# Patient Record
Sex: Male | Born: 1960 | Race: White | Hispanic: No | Marital: Married | Smoking: Former smoker
Health system: Southern US, Community
[De-identification: ages and names within clinical notes are randomized; demographics above are authoritative.]

## PROBLEM LIST (undated history)

## (undated) DIAGNOSIS — R0683 Snoring: Secondary | ICD-10-CM

## (undated) DIAGNOSIS — E669 Obesity, unspecified: Secondary | ICD-10-CM

## (undated) DIAGNOSIS — M109 Gout, unspecified: Secondary | ICD-10-CM

## (undated) DIAGNOSIS — N183 Chronic kidney disease, stage 3 unspecified: Secondary | ICD-10-CM

## (undated) DIAGNOSIS — E781 Pure hyperglyceridemia: Secondary | ICD-10-CM

## (undated) DIAGNOSIS — E8881 Metabolic syndrome: Secondary | ICD-10-CM

## (undated) DIAGNOSIS — C20 Malignant neoplasm of rectum: Secondary | ICD-10-CM

## (undated) DIAGNOSIS — I10 Essential (primary) hypertension: Secondary | ICD-10-CM

## (undated) DIAGNOSIS — Z87442 Personal history of urinary calculi: Secondary | ICD-10-CM

## (undated) DIAGNOSIS — K4090 Unilateral inguinal hernia, without obstruction or gangrene, not specified as recurrent: Secondary | ICD-10-CM

## (undated) DIAGNOSIS — H9319 Tinnitus, unspecified ear: Secondary | ICD-10-CM

## (undated) DIAGNOSIS — R7301 Impaired fasting glucose: Secondary | ICD-10-CM

## (undated) DIAGNOSIS — N200 Calculus of kidney: Secondary | ICD-10-CM

## (undated) HISTORY — DX: Metabolic syndrome: E88.810

## (undated) HISTORY — DX: Impaired fasting glucose: R73.01

## (undated) HISTORY — DX: Snoring: R06.83

## (undated) HISTORY — DX: Gout, unspecified: M10.9

## (undated) HISTORY — DX: Calculus of kidney: N20.0

## (undated) HISTORY — DX: Chronic kidney disease, stage 3 (moderate): N18.3

## (undated) HISTORY — DX: Pure hyperglyceridemia: E78.1

## (undated) HISTORY — DX: Chronic kidney disease, stage 3 unspecified: N18.30

## (undated) HISTORY — DX: Metabolic syndrome: E88.81

---

## 2011-05-11 DIAGNOSIS — Z87442 Personal history of urinary calculi: Secondary | ICD-10-CM

## 2011-05-11 HISTORY — DX: Personal history of urinary calculi: Z87.442

## 2015-05-11 HISTORY — PX: XI ROBOTIC ASSISTED LOWER ANTERIOR RESECTION: SHX6558

## 2015-10-08 ENCOUNTER — Other Ambulatory Visit: Payer: Self-pay | Admitting: Gastroenterology

## 2015-10-08 DIAGNOSIS — K629 Disease of anus and rectum, unspecified: Secondary | ICD-10-CM

## 2015-10-09 ENCOUNTER — Ambulatory Visit
Admission: RE | Admit: 2015-10-09 | Discharge: 2015-10-09 | Disposition: A | Discharge status: Home / Self Care | Payer: BLUE CROSS/BLUE SHIELD | Source: Ambulatory Visit | Attending: Gastroenterology | Admitting: Gastroenterology

## 2015-10-09 DIAGNOSIS — K629 Disease of anus and rectum, unspecified: Secondary | ICD-10-CM

## 2015-10-09 MED ORDER — IOPAMIDOL (ISOVUE-300) INJECTION 61%
125.0000 mL | Freq: Once | INTRAVENOUS | Status: AC | PRN
Start: 2015-10-09 — End: 2015-10-09
  Administered 2015-10-09: 125 mL via INTRAVENOUS

## 2015-10-13 ENCOUNTER — Ambulatory Visit: Payer: Self-pay | Admitting: Surgery

## 2015-10-13 NOTE — H&P (Signed)
Jon Frost 10/13/2015 4:03 PM Location: [facility] Patient #: [id number] DOB: Aug 08, 1960 Married / Language: [name] / Race: White Male  History of Present Illness Jon Hector MD; 10/13/2015 4:54 PM) The patient is a 55 year old male who presents with colorectal cancer. Note for "Colorectal cancer": Patient sent for surgical consultation by Dr. Clarene Essex for concern of cancer at rectosigmoid junction. Initially sent to [facility] since patient's wife had surgery by him. Dr. Donne Hazel referred to myself given my higher colorectal surgical volume.  Pleasant obese male. Comes today with his wife. Underwent screening colonoscopy. Found to have partially obstructing mass in distal sigmoid colon. 4 cm long. Not able to pass standard endoscope. Was able to get pediatric endoscope. Small polyps x 2 noted in descending and transverse colon. Not able to get to the hepatic flexure. Recommendation made for surgical consultation as biopsy consistent with adenocarcinoma. CT of abdomen and pelvis show no evidence of any metastatic disease. CEA elevated at 21.  Patient comes in today with his wife. He normally can walk an hour without difficulty. He's never had abdominal surgery. Usually has a bowel movement every day. However he notes that the calibers or not normal. He thinks are coming out in smaller pieces and may be more narrow. Not really rabbit pellets style. He recalls being told he had diverticulosis on a CAT scan for kidney stones about 5 years ago. No other abdominal issues. He does not smoke.  No personal nor family history of GI/colon cancer, inflammatory bowel disease, irritable bowel syndrome, allergy such as Celiac Sprue, dietary/dairy problems, colitis, ulcers nor gastritis. No recent sick contacts/gastroenteritis. No travel outside the country. No changes in diet. No dysphagia to solids or liquids. No significant heartburn or reflux. No hematochezia,  hematemesis, coffee ground emesis. No evidence of prior gastric/peptic ulceration.   Allergies (Sonya Bynum, CMA; 10/13/2015 4:04 PM) No Known Drug Allergies 10/09/2015  Medication History (Sonya Bynum, CMA; 10/13/2015 4:04 PM) HydroCHLOROthiazide (25MG  Tablet, 1 (one) Tablet Oral daily, Taken starting 10/09/2015) Active. Medications Reconciled    Vitals (Sonya Bynum CMA; 10/13/2015 4:03 PM) 10/13/2015 4:03 PM Weight: 220 lb Height: 71in Body Surface Area: 2.2 m Body Mass Index: 30.68 kg/m  Temp.: 41F(Temporal)  Pulse: 79 (Regular)  BP: 126/80 (Sitting, Left Arm, Standard)      Physical Exam Jon Hector MD; 10/13/2015 4:39 PM)  General Mental Status-Alert. General Appearance-Not in acute distress, Not Sickly. Orientation-Oriented X3. Hydration-Well hydrated. Voice-Normal.  Integumentary Global Assessment Upon inspection and palpation of skin surfaces of the - Axillae: non-tender, no inflammation or ulceration, no drainage. and Distribution of scalp and body hair is normal. General Characteristics Temperature - normal warmth is noted.  Head and Neck Head-normocephalic, atraumatic with no lesions or palpable masses. Face Global Assessment - atraumatic, no absence of expression. Neck Global Assessment - no abnormal movements, no bruit auscultated on the right, no bruit auscultated on the left, no decreased range of motion, non-tender. Trachea-midline. Thyroid Gland Characteristics - non-tender.  Eye Eyeball - Left-Extraocular movements intact, No Nystagmus. Eyeball - Right-Extraocular movements intact, No Nystagmus. Cornea - Left-No Hazy. Cornea - Right-No Hazy. Sclera/Conjunctiva - Left-No scleral icterus, No Discharge. Sclera/Conjunctiva - Right-No scleral icterus, No Discharge. Pupil - Left-Direct reaction to light normal. Pupil - Right-Direct reaction to light normal.  ENMT Ears Pinna - Left - no drainage  observed, no generalized tenderness observed. Right - no drainage observed, no generalized tenderness observed. Nose and Sinuses External Inspection of the Nose -  no destructive lesion observed. Inspection of the nares - Left - quiet respiration. Right - quiet respiration. Mouth and Throat Lips - Upper Lip - no fissures observed, no pallor noted. Lower Lip - no fissures observed, no pallor noted. Nasopharynx - no discharge present. Oral Cavity/Oropharynx - Tongue - no dryness observed. Oral Mucosa - no cyanosis observed. Hypopharynx - no evidence of airway distress observed.  Chest and Lung Exam Inspection Movements - Normal and Symmetrical. Accessory muscles - No use of accessory muscles in breathing. Palpation Palpation of the chest reveals - Non-tender. Auscultation Breath sounds - Normal and Clear.  Cardiovascular Auscultation Rhythm - Regular. Murmurs & Other Heart Sounds - Auscultation of the heart reveals - No Murmurs and No Systolic Clicks.  Abdomen Inspection Inspection of the abdomen reveals - No Visible peristalsis and No Abnormal pulsations. Umbilicus - No Bleeding, No Urine drainage. Palpation/Percussion Palpation and Percussion of the abdomen reveal - Soft, Non Tender, No Rebound tenderness, No Rigidity (guarding) and No Cutaneous hyperesthesia. Note: Obese but soft. Moderate suprapubic umbilical diastases recti. No umbilical hernia. Nontender nondistended.  Male Genitourinary Sexual Maturity Tanner 5 - Adult hair pattern and Adult penile size and shape. Note: Normal external genitalia. No inguinal hernias. No lymphadenopathy.  Peripheral Vascular Upper Extremity Inspection - Left - No Cyanotic nailbeds, Not Ischemic. Right - No Cyanotic nailbeds, Not Ischemic.  Neurologic Neurologic evaluation reveals -normal attention span and ability to concentrate, able to name objects and repeat phrases. Appropriate fund of knowledge , normal sensation and normal  coordination. Mental Status Affect - not angry, not paranoid. Cranial Nerves-Normal Bilaterally. Gait-Normal.  Neuropsychiatric Mental status exam performed with findings of-able to articulate well with normal speech/language, rate, volume and coherence, thought content normal with ability to perform basic computations and apply abstract reasoning and no evidence of hallucinations, delusions, obsessions or homicidal/suicidal ideation. Note: Quiet. Somewhat grim face.  Musculoskeletal Global Assessment Spine, Ribs and Pelvis - no instability, subluxation or laxity. Right Upper Extremity - no instability, subluxation or laxity.  Lymphatic Head & Neck  General Head & Neck Lymphatics: Bilateral - Description - No Localized lymphadenopathy. Axillary  General Axillary Region: Bilateral - Description - No Localized lymphadenopathy. Femoral & Inguinal  Generalized Femoral & Inguinal Lymphatics: Left - Description - No Localized lymphadenopathy. Right - Description - No Localized lymphadenopathy.    Assessment & Plan Jon Hector MD; 10/13/2015 4:56 PM)  CARCINOMA OF RECTOSIGMOID (COLON) (C19) Impression: Ulcerated partially obstructing rectosigmoid mass. 4 cm long. Tattooed. I think this would benefit from removal. Reasonable robotic sigmoid colectomy/low anterior resection.  Discuss with them at length. They wish to proceed. He is hoping to get it done before the end of the month.  Patient needs CT scan of chest for completion of workup. CEA is elevated but no evidence of abdominal or pelvic disease outside of the tumor.  I went over recovery expectations. Until people usually make out less than a week. Patient is confident he'll be out in a couple days. I cautioned him against being overly optimistic, but I will work to try and help him have a speedy and nonproblematic recovering  POLYP OF TRANSVERSE COLON, UNSPECIFIED TYPE (D12.3) Impression: Probable polyp of transverse &  descending colon. Not able to get proximal to the proximal transverse colon according to Dr. Perley Jain notes. I think theywere left in place. CT scan does not see any obvious endoluminal polyps or defects to my view, so I doubt there is a cancer or large polyp proximal to  where he looked.  I think Dr Perley Jain plan is to do a completion follow-up colonoscopy 6 months postop and polypectomies at that time. Patient and wife not certain. Not able to reach Dr. Watt Climes today as he is out of the country. However, was able to reach his nurse, Pamala Hurry. She confirmed the plan was to do follow-up colonoscopy after cancer resection to remove remaining polyps. Probably will do it in 6 months.  POLYP OF DESCENDING COLON, UNSPECIFIED TYPE (D12.4)  ENCOUNTER FOR PREOPERATIVE EXAMINATION FOR GENERAL SURGICAL PROCEDURE (Z01.818)  Current Plans You are being scheduled for surgery - Our schedulers will call you.  You should hear from our office's scheduling department within 5 working days about the location, date, and time of surgery. We try to make accommodations for patient's preferences in scheduling surgery, but sometimes the OR schedule or the surgeon's schedule prevents [place] from making those accommodations.  If you have not heard from our office [phone]) in 5 working days, call the office and ask for your surgeon's nurse.  If you have other questions about your diagnosis, plan, or surgery, call the office and ask for your surgeon's nurse.  Written instructions provided Pt Education - [facility] Colon Bowel Prep 2015 Miralax/Antibiotics Started Neomycin Sulfate 500MG , 2 (two) Tablet SEE NOTE, #6, 10/13/2015, No Refill. Local Order: TAKE TWO TABLETS AT 2 PM, 3 PM, AND 10 PM THE DAY PRIOR TO SURGERY Started Flagyl 500MG , 2 (two) Tablet SEE NOTE, #6, 10/13/2015, No Refill. Local Order: Take at 2pm, 3pm, and 10pm the day prior to your colon operation Pt Education - [facility] Colectomy post-op instructions: discussed with  patient and provided information. Pt Education - Pamphlet Given - Laparoscopic Colorectal Surgery: discussed with patient and provided information. Pt Education - [facility] Good Bowel Health (Vinny Taranto) Pt Education - [facility] Pain Control (Matti Minney)  Jon Frost, M.D., F.A.C.S. Gastrointestinal and Minimally Invasive Surgery [facility] [address] [place], [place] [address] [phone] Main / Paging

## 2015-10-28 ENCOUNTER — Other Ambulatory Visit: Payer: Self-pay | Admitting: Surgery

## 2015-10-28 DIAGNOSIS — C19 Malignant neoplasm of rectosigmoid junction: Secondary | ICD-10-CM

## 2015-11-07 ENCOUNTER — Other Ambulatory Visit (HOSPITAL_COMMUNITY): Payer: Self-pay | Admitting: Anesthesiology

## 2015-11-07 NOTE — Patient Instructions (Addendum)
Sayre Antonacci  11/07/2015   Your procedure is scheduled on: 11/13/15  Report to [facility] Main  Entrance take [facility]  elevators to 3rd floor to  [place] at  [place] AM.  Call this number if you have problems the morning of surgery [phone]   Remember: ONLY 1 PERSON MAY GO WITH YOU TO SHORT STAY TO GET  READY MORNING OF [place].  Do not eat food after midnight Tuesday NIGHT DRINK CLEAR LIQUIDS ONLY Wednesday FOR BOWEL PREP AS PER OFFICE - INCREASE INTAKE  NOTHING BY MOUTH AFTER MIDNIGHT  Release pre-op Bowel Prep Medications: bisacodyl, polyethylene glycol powder, neomycin, metroNIDAZOLE by 7 am the day prior to surgery. Refer to sidebar report: Colon Bowel Prep     Take these medicines the morning of surgery with A SIP OF WATER: NONE                                You may not have any metal on your body including hair pins and              piercings  Do not wear jewelry, make-up, lotions, powders or perfumes, deodorant             Do not wear nail polish.  Do not shave  48 hours prior to surgery.              Men may shave face and neck.   Do not bring valuables to the hospital. [place].  Contacts, dentures or bridgework may not be worn into surgery.  Leave suitcase in the car. After surgery it may be brought to your room.               Please read over the following fact sheets you were given: _____________________________________________________________________             [facility] - Preparing for Surgery Before surgery, you can play an important role.  Because skin is not sterile, your skin needs to be as free of germs as possible.  You can reduce the number of germs on your skin by washing with CHG (chlorahexidine gluconate) soap before surgery.  CHG is an antiseptic cleaner which kills germs and bonds with the skin to continue killing germs even after washing. Please DO NOT use if you  have an allergy to CHG or antibacterial soaps.  If your skin becomes reddened/irritated stop using the CHG and inform your nurse when you arrive at Short Stay. Do not shave (including legs and underarms) for at least 48 hours prior to the first CHG shower.  You may shave your face/neck. Please follow these instructions carefully:  1.  Shower with CHG Soap the night before surgery and the  morning of Surgery.  2.  If you choose to wash your hair, wash your hair first as usual with your  normal  shampoo.  3.  After you shampoo, rinse your hair and body thoroughly to remove the  shampoo.                           4.  Use CHG as you would any other liquid soap.  You can apply chg  directly  to the skin and wash                       Gently with a scrungie or clean washcloth.  5.  Apply the CHG Soap to your body ONLY FROM THE NECK DOWN.   Do not use on face/ open                           Wound or open sores. Avoid contact with eyes, ears mouth and genitals (private parts).                       Wash face,  Genitals (private parts) with your normal soap.             6.  Wash thoroughly, paying special attention to the area where your surgery  will be performed.  7.  Thoroughly rinse your body with warm water from the neck down.  8.  DO NOT shower/wash with your normal soap after using and rinsing off  the CHG Soap.                9.  Pat yourself dry with a clean towel.            10.  Wear clean pajamas.            11.  Place clean sheets on your bed the night of your first shower and do not  sleep with pets. Day of Surgery : Do not apply any lotions/deodorants the morning of surgery.  Please wear clean clothes to the hospital/surgery center.  FAILURE TO FOLLOW THESE INSTRUCTIONS MAY RESULT IN THE CANCELLATION OF YOUR SURGERY PATIENT SIGNATURE_________________________________  NURSE  SIGNATURE__________________________________  ________________________________________________________________________  WHAT IS A BLOOD TRANSFUSION? Blood Transfusion Information  A transfusion is the replacement of blood or some of its parts. Blood is made up of multiple cells which provide different functions.  Red blood cells carry oxygen and are used for blood loss replacement.  White blood cells fight against infection.  Platelets control bleeding.  Plasma helps clot blood.  Other blood products are available for specialized needs, such as hemophilia or other clotting disorders. BEFORE THE TRANSFUSION  Who gives blood for transfusions?   Healthy volunteers who are fully evaluated to make sure their blood is safe. This is blood bank blood. Transfusion therapy is the safest it has ever been in the practice of medicine. Before blood is taken from a donor, a complete history is taken to make sure that person has no history of diseases nor engages in risky social behavior (examples are intravenous drug use or sexual activity with multiple partners). The donor's travel history is screened to minimize risk of transmitting infections, such as malaria. The donated blood is tested for signs of infectious diseases, such as HIV and hepatitis. The blood is then tested to be sure it is compatible with you in order to minimize the chance of a transfusion reaction. If you or a relative donates blood, this is often done in anticipation of surgery and is not appropriate for emergency situations. It takes many days to process the donated blood. RISKS AND COMPLICATIONS Although transfusion therapy is very safe and saves many lives, the main dangers of transfusion include:   Getting an infectious disease.  Developing a transfusion reaction. This is an allergic reaction to something in the blood you were given. Every precaution is taken  to prevent this. The decision to have a blood transfusion has been  considered carefully by your caregiver before blood is given. Blood is not given unless the benefits outweigh the risks. AFTER THE TRANSFUSION  Right after receiving a blood transfusion, you will usually feel much better and more energetic. This is especially true if your red blood cells have gotten low (anemic). The transfusion raises the level of the red blood cells which carry oxygen, and this usually causes an energy increase.  The nurse administering the transfusion will monitor you carefully for complications. HOME CARE INSTRUCTIONS  No special instructions are needed after a transfusion. You may find your energy is better. Speak with your caregiver about any limitations on activity for underlying diseases you may have. SEEK MEDICAL CARE IF:   Your condition is not improving after your transfusion.  You develop redness or irritation at the intravenous (IV) site. SEEK IMMEDIATE MEDICAL CARE IF:  Any of the following symptoms occur over the next 12 hours:  Shaking chills.  You have a temperature by mouth above 102 F (38.9 C), not controlled by medicine.  Chest, back, or muscle pain.  People around you feel you are not acting correctly or are confused.  Shortness of breath or difficulty breathing.  Dizziness and fainting.  You get a rash or develop hives.  You have a decrease in urine output.  Your urine turns a dark color or changes to pink, red, or brown. Any of the following symptoms occur over the next 10 days:  You have a temperature by mouth above 102 F (38.9 C), not controlled by medicine.  Shortness of breath.  Weakness after normal activity.  The white part of the eye turns yellow (jaundice).  You have a decrease in the amount of urine or are urinating less often.  Your urine turns a dark color or changes to pink, red, or brown. Document Released: 04/23/2000 Document Revised: 07/19/2011 Document Reviewed: 12/11/2007 ExitCare Patient Information 2014  ExitCare, [place].  _______________________________________________________________________   CLEAR LIQUID DIET   Foods Allowed                                                                     Foods Excluded  Coffee and tea, regular and decaf                             liquids that you cannot  Plain Jell-O in any flavor                                             see through such as: Fruit ices (not with fruit pulp)                                     milk, soups, orange juice  Iced Popsicles  All solid food Carbonated beverages, regular and diet                                    Cranberry, grape and apple juices Sports drinks like Gatorade Lightly seasoned clear broth or consume(fat free) Sugar, honey syrup  Sample Menu Breakfast                                Lunch                                     Supper Cranberry juice                    Beef broth                            Chicken broth Jell-O                                     Grape juice                           Apple juice Coffee or tea                        Jell-O                                      Popsicle                                                Coffee or tea                        Coffee or tea  _____________________________________________________________________

## 2015-11-10 ENCOUNTER — Ambulatory Visit
Admission: RE | Admit: 2015-11-10 | Discharge: 2015-11-10 | Disposition: A | Discharge status: Home / Self Care | Payer: BLUE CROSS/BLUE SHIELD | Source: Ambulatory Visit | Attending: Surgery | Admitting: Surgery

## 2015-11-10 ENCOUNTER — Encounter (HOSPITAL_COMMUNITY): Payer: Self-pay

## 2015-11-10 ENCOUNTER — Encounter (HOSPITAL_COMMUNITY)
Admission: RE | Admit: 2015-11-10 | Discharge: 2015-11-10 | Disposition: A | Discharge status: Home / Self Care | Payer: BLUE CROSS/BLUE SHIELD | Source: Ambulatory Visit | Attending: Surgery | Admitting: Surgery

## 2015-11-10 ENCOUNTER — Other Ambulatory Visit: Payer: Self-pay

## 2015-11-10 DIAGNOSIS — I10 Essential (primary) hypertension: Secondary | ICD-10-CM

## 2015-11-10 DIAGNOSIS — Z0183 Encounter for blood typing: Secondary | ICD-10-CM | POA: Insufficient documentation

## 2015-11-10 DIAGNOSIS — R001 Bradycardia, unspecified: Secondary | ICD-10-CM

## 2015-11-10 DIAGNOSIS — C19 Malignant neoplasm of rectosigmoid junction: Secondary | ICD-10-CM

## 2015-11-10 DIAGNOSIS — Z01818 Encounter for other preprocedural examination: Secondary | ICD-10-CM

## 2015-11-10 DIAGNOSIS — Z01812 Encounter for preprocedural laboratory examination: Secondary | ICD-10-CM

## 2015-11-10 HISTORY — DX: Essential (primary) hypertension: I10

## 2015-11-10 HISTORY — DX: Personal history of urinary calculi: Z87.442

## 2015-11-10 LAB — CBC
HEMATOCRIT: 41.6 % (ref 39.0–52.0)
HEMOGLOBIN: 15.3 g/dL (ref 13.0–17.0)
MCH: 33.1 pg (ref 26.0–34.0)
MCHC: 36.8 g/dL — ABNORMAL HIGH (ref 30.0–36.0)
MCV: 90 fL (ref 78.0–100.0)
Platelets: 166 10*3/uL (ref 150–400)
RBC: 4.62 MIL/uL (ref 4.22–5.81)
RDW: 12.9 % (ref 11.5–15.5)
WBC: 5.5 10*3/uL (ref 4.0–10.5)

## 2015-11-10 LAB — BASIC METABOLIC PANEL
ANION GAP: 7 (ref 5–15)
BUN: 17 mg/dL (ref 6–20)
CHLORIDE: 103 mmol/L (ref 101–111)
CO2: 29 mmol/L (ref 22–32)
Calcium: 9.6 mg/dL (ref 8.9–10.3)
Creatinine, Ser: 1.08 mg/dL (ref 0.61–1.24)
GFR calc non Af Amer: >60 mL/min (ref >60)
Glucose, Bld: 93 mg/dL (ref 65–99)
POTASSIUM: 4.2 mmol/L (ref 3.5–5.1)
Sodium: 139 mmol/L (ref 135–145)

## 2015-11-10 LAB — ABO/RH: ABO/RH(D): O POS

## 2015-11-10 MED ORDER — IOPAMIDOL (ISOVUE-300) INJECTION 61%
75.0000 mL | Freq: Once | INTRAVENOUS | Status: AC | PRN
  Administered 2015-11-10: 75 mL via INTRAVENOUS

## 2015-11-10 NOTE — Progress Notes (Signed)
bp repeated x 3- states has white coat syndrome

## 2015-11-11 LAB — HEMOGLOBIN A1C
HEMOGLOBIN A1C: 5 % (ref 4.8–5.6)
Mean Plasma Glucose: 97 mg/dL

## 2015-11-12 ENCOUNTER — Encounter (HOSPITAL_COMMUNITY): Payer: Self-pay | Admitting: Anesthesiology

## 2015-11-12 MED ORDER — SODIUM CHLORIDE 0.9 % IV SOLN
INTRAVENOUS | Status: DC
  Filled 2015-11-12: qty 6

## 2015-11-12 NOTE — Progress Notes (Signed)
STOP BANG SCORE OF 5 AT PRE OP TESTING APPT

## 2015-11-12 NOTE — Anesthesia Preprocedure Evaluation (Signed)
Anesthesia Evaluation  Patient identified by MRN, date of birth, ID band Patient awake    Reviewed: Allergy & Precautions, NPO status , Patient's Chart, lab work & pertinent test results  Airway Mallampati: II  TM Distance: >3 FB Neck ROM: Full    Dental no notable dental hx.    Pulmonary former smoker,    Pulmonary exam normal breath sounds clear to auscultation       Cardiovascular hypertension, Pt. on medications Normal cardiovascular exam Rhythm:Regular Rate:Normal     Neuro/Psych negative neurological ROS  negative psych ROS   GI/Hepatic negative GI ROS, Neg liver ROS,   Endo/Other  negative endocrine ROS  Renal/GU negative Renal ROS  negative genitourinary   Musculoskeletal negative musculoskeletal ROS (+)   Abdominal   Peds negative pediatric ROS (+)  Hematology negative hematology ROS (+)   Anesthesia Other Findings   Reproductive/Obstetrics negative OB ROS                             Anesthesia Physical Anesthesia Plan  ASA: II  Anesthesia Plan: General   Post-op Pain Management:    Induction: Intravenous  Airway Management Planned: Oral ETT  Additional Equipment:   Intra-op Plan:   Post-operative Plan: Extubation in OR  Informed Consent: I have reviewed the patients History and Physical, chart, labs and discussed the procedure including the risks, benefits and alternatives for the proposed anesthesia with the patient or authorized representative who has indicated his/her understanding and acceptance.   Dental advisory given  Plan Discussed with: CRNA  Anesthesia Plan Comments:         Anesthesia Quick Evaluation

## 2015-11-13 ENCOUNTER — Inpatient Hospital Stay (HOSPITAL_COMMUNITY)
Admission: RE | Admit: 2015-11-13 | Discharge: 2015-11-17 | DRG: 331 | Disposition: A | Discharge status: Home / Self Care | Payer: BLUE CROSS/BLUE SHIELD | Source: Ambulatory Visit | Attending: General Surgery | Admitting: General Surgery

## 2015-11-13 ENCOUNTER — Inpatient Hospital Stay (HOSPITAL_COMMUNITY): Payer: BLUE CROSS/BLUE SHIELD | Admitting: Anesthesiology

## 2015-11-13 ENCOUNTER — Encounter (HOSPITAL_COMMUNITY)
Admission: RE | Disposition: A | Discharge status: Home / Self Care | Payer: Self-pay | Source: Ambulatory Visit | Attending: General Surgery

## 2015-11-13 ENCOUNTER — Encounter (HOSPITAL_COMMUNITY): Payer: Self-pay

## 2015-11-13 DIAGNOSIS — C19 Malignant neoplasm of rectosigmoid junction: Principal | ICD-10-CM | POA: Diagnosis present

## 2015-11-13 DIAGNOSIS — Z87442 Personal history of urinary calculi: Secondary | ICD-10-CM

## 2015-11-13 DIAGNOSIS — Z79899 Other long term (current) drug therapy: Secondary | ICD-10-CM

## 2015-11-13 DIAGNOSIS — I10 Essential (primary) hypertension: Secondary | ICD-10-CM | POA: Diagnosis present

## 2015-11-13 DIAGNOSIS — C20 Malignant neoplasm of rectum: Secondary | ICD-10-CM

## 2015-11-13 DIAGNOSIS — Z6830 Body mass index (BMI) 30.0-30.9, adult: Secondary | ICD-10-CM

## 2015-11-13 DIAGNOSIS — E669 Obesity, unspecified: Secondary | ICD-10-CM

## 2015-11-13 HISTORY — DX: Obesity, unspecified: E66.9

## 2015-11-13 HISTORY — DX: Malignant neoplasm of rectum: C20

## 2015-11-13 LAB — TYPE AND SCREEN
ABO/RH(D): O POS
Antibody Screen: NEGATIVE

## 2015-11-13 SURGERY — COLECTOMY, PARTIAL, ROBOT-ASSISTED, LAPAROSCOPIC
Anesthesia: General | Site: Abdomen

## 2015-11-13 MED ORDER — POLYETHYLENE GLYCOL 3350 17 GM/SCOOP PO POWD
1.0000 | Freq: Once | ORAL | Status: DC
  Filled 2015-11-13: qty 255

## 2015-11-13 MED ORDER — LIDOCAINE HCL (CARDIAC) 20 MG/ML IV SOLN
INTRAVENOUS | Status: DC | PRN
  Administered 2015-11-13: 100 mg via INTRAVENOUS

## 2015-11-13 MED ORDER — DEXAMETHASONE SODIUM PHOSPHATE 10 MG/ML IJ SOLN
INTRAMUSCULAR | Status: AC
  Filled 2015-11-13: qty 1

## 2015-11-13 MED ORDER — PROCHLORPERAZINE EDISYLATE 5 MG/ML IJ SOLN
10.0000 mg | Freq: Four times a day (QID) | INTRAMUSCULAR | Status: DC | PRN
  Filled 2015-11-13: qty 2

## 2015-11-13 MED ORDER — ROCURONIUM BROMIDE 100 MG/10ML IV SOLN
INTRAVENOUS | Status: AC
  Filled 2015-11-13: qty 1

## 2015-11-13 MED ORDER — DIPHENHYDRAMINE HCL 12.5 MG/5ML PO ELIX: 12.5000 mg | ORAL_SOLUTION | Freq: Four times a day (QID) | ORAL | Status: DC | PRN

## 2015-11-13 MED ORDER — HYDROMORPHONE HCL 1 MG/ML IJ SOLN
0.2500 mg | INTRAMUSCULAR | Status: DC | PRN
  Administered 2015-11-13 (×2): 0.5 mg via INTRAVENOUS

## 2015-11-13 MED ORDER — ACETAMINOPHEN 500 MG PO TABS
1000.0000 mg | ORAL_TABLET | Freq: Three times a day (TID) | ORAL | Status: DC
  Administered 2015-11-13 – 2015-11-16 (×10): 1000 mg via ORAL
  Filled 2015-11-13 (×10): qty 2

## 2015-11-13 MED ORDER — ONDANSETRON HCL 4 MG/2ML IJ SOLN
INTRAMUSCULAR | Status: DC | PRN
  Administered 2015-11-13: 4 mg via INTRAVENOUS

## 2015-11-13 MED ORDER — SUCCINYLCHOLINE CHLORIDE 20 MG/ML IJ SOLN
INTRAMUSCULAR | Status: DC | PRN
  Administered 2015-11-13: 100 mg via INTRAVENOUS

## 2015-11-13 MED ORDER — ENOXAPARIN SODIUM 40 MG/0.4ML ~~LOC~~ SOLN
40.0000 mg | Freq: Once | SUBCUTANEOUS | Status: AC
Start: 2015-11-13 — End: 2015-11-13
  Administered 2015-11-13: 40 mg via SUBCUTANEOUS
  Filled 2015-11-13: qty 0.4

## 2015-11-13 MED ORDER — METHOCARBAMOL 1000 MG/10ML IJ SOLN
1000.0000 mg | Freq: Four times a day (QID) | INTRAVENOUS | Status: DC | PRN
  Filled 2015-11-13: qty 10

## 2015-11-13 MED ORDER — HYDROMORPHONE HCL 1 MG/ML IJ SOLN
0.5000 mg | INTRAMUSCULAR | Status: DC | PRN
  Administered 2015-11-13: 2 mg via INTRAVENOUS
  Administered 2015-11-13: 1 mg via INTRAVENOUS
  Administered 2015-11-13: 2 mg via INTRAVENOUS
  Administered 2015-11-13 (×2): 1 mg via INTRAVENOUS
  Administered 2015-11-14 (×3): 2 mg via INTRAVENOUS
  Administered 2015-11-14: 1 mg via INTRAVENOUS
  Administered 2015-11-14: 2 mg via INTRAVENOUS
  Administered 2015-11-15 – 2015-11-16 (×5): 1 mg via INTRAVENOUS
  Filled 2015-11-13 (×4): qty 2
  Filled 2015-11-13 (×6): qty 1
  Filled 2015-11-13: qty 2
  Filled 2015-11-13 (×3): qty 1
  Filled 2015-11-13: qty 2

## 2015-11-13 MED ORDER — DEXAMETHASONE SODIUM PHOSPHATE 10 MG/ML IJ SOLN
INTRAMUSCULAR | Status: DC | PRN
  Administered 2015-11-13: 10 mg via INTRAVENOUS

## 2015-11-13 MED ORDER — BUPIVACAINE-EPINEPHRINE 0.25% -1:200000 IJ SOLN
INTRAMUSCULAR | Status: DC | PRN
  Administered 2015-11-13: 100 mL

## 2015-11-13 MED ORDER — SODIUM CHLORIDE 0.9 % IV SOLN
INTRAVENOUS | Status: DC | PRN
  Administered 2015-11-13: 09:00:00 via INTRAPERITONEAL

## 2015-11-13 MED ORDER — ALVIMOPAN 12 MG PO CAPS
12.0000 mg | ORAL_CAPSULE | Freq: Two times a day (BID) | ORAL | Status: DC
  Administered 2015-11-14: 12 mg via ORAL
  Filled 2015-11-13 (×2): qty 1

## 2015-11-13 MED ORDER — ALVIMOPAN 12 MG PO CAPS
12.0000 mg | ORAL_CAPSULE | Freq: Once | ORAL | Status: AC
  Administered 2015-11-13: 12 mg via ORAL
  Filled 2015-11-13: qty 1

## 2015-11-13 MED ORDER — 0.9 % SODIUM CHLORIDE (POUR BTL) OPTIME
TOPICAL | Status: DC | PRN
  Administered 2015-11-13: 3000 mL

## 2015-11-13 MED ORDER — MIDAZOLAM HCL 2 MG/2ML IJ SOLN
INTRAMUSCULAR | Status: AC
  Filled 2015-11-13: qty 2

## 2015-11-13 MED ORDER — BISACODYL 5 MG PO TBEC
20.0000 mg | DELAYED_RELEASE_TABLET | Freq: Once | ORAL | Status: DC
  Filled 2015-11-13: qty 4

## 2015-11-13 MED ORDER — LIDOCAINE HCL (CARDIAC) 20 MG/ML IV SOLN
INTRAVENOUS | Status: AC
  Filled 2015-11-13: qty 5

## 2015-11-13 MED ORDER — METOPROLOL TARTRATE 5 MG/5ML IV SOLN: 5.0000 mg | Freq: Four times a day (QID) | INTRAVENOUS | Status: DC | PRN

## 2015-11-13 MED ORDER — BUPIVACAINE LIPOSOME 1.3 % IJ SUSP
20.0000 mL | INTRAMUSCULAR | Status: DC
  Filled 2015-11-13: qty 20

## 2015-11-13 MED ORDER — NAPROXEN 500 MG PO TABS: 500.0000 mg | ORAL_TABLET | Freq: Two times a day (BID) | ORAL | Status: DC

## 2015-11-13 MED ORDER — ADULT MULTIVITAMIN W/MINERALS CH
1.0000 | ORAL_TABLET | Freq: Every day | ORAL | Status: DC
  Administered 2015-11-13 – 2015-11-16 (×4): 1 via ORAL
  Filled 2015-11-13 (×6): qty 1

## 2015-11-13 MED ORDER — METOCLOPRAMIDE HCL 5 MG/ML IJ SOLN: 5.0000 mg | Freq: Four times a day (QID) | INTRAMUSCULAR | Status: DC | PRN

## 2015-11-13 MED ORDER — SODIUM CHLORIDE 0.9 % IJ SOLN
INTRAMUSCULAR | Status: AC
  Filled 2015-11-13: qty 10

## 2015-11-13 MED ORDER — BUPIVACAINE LIPOSOME 1.3 % IJ SUSP
INTRAMUSCULAR | Status: DC | PRN
  Administered 2015-11-13: 20 mL

## 2015-11-13 MED ORDER — PROPOFOL 10 MG/ML IV BOLUS
INTRAVENOUS | Status: AC
  Filled 2015-11-13: qty 20

## 2015-11-13 MED ORDER — ROCURONIUM BROMIDE 100 MG/10ML IV SOLN
INTRAVENOUS | Status: DC | PRN
  Administered 2015-11-13 (×2): 20 mg via INTRAVENOUS
  Administered 2015-11-13: 5 mg via INTRAVENOUS
  Administered 2015-11-13: 55 mg via INTRAVENOUS

## 2015-11-13 MED ORDER — CHLORHEXIDINE GLUCONATE 4 % EX LIQD: 60.0000 mL | Freq: Once | CUTANEOUS | Status: DC

## 2015-11-13 MED ORDER — SUGAMMADEX SODIUM 200 MG/2ML IV SOLN
INTRAVENOUS | Status: DC | PRN
Start: 2015-11-13 — End: 2015-11-13
  Administered 2015-11-13: 200 mg via INTRAVENOUS

## 2015-11-13 MED ORDER — PROMETHAZINE HCL 25 MG/ML IJ SOLN: 6.2500 mg | INTRAMUSCULAR | Status: DC | PRN

## 2015-11-13 MED ORDER — ONE-DAILY MULTI VITAMINS PO TABS: 1.0000 | ORAL_TABLET | Freq: Every day | ORAL | Status: DC

## 2015-11-13 MED ORDER — ALUM & MAG HYDROXIDE-SIMETH 200-200-20 MG/5ML PO SUSP: 30.0000 mL | Freq: Four times a day (QID) | ORAL | Status: DC | PRN

## 2015-11-13 MED ORDER — KETOROLAC TROMETHAMINE 30 MG/ML IJ SOLN
INTRAMUSCULAR | Status: AC
  Filled 2015-11-13: qty 1

## 2015-11-13 MED ORDER — BUPIVACAINE-EPINEPHRINE 0.25% -1:200000 IJ SOLN
INTRAMUSCULAR | Status: AC
  Filled 2015-11-13: qty 2

## 2015-11-13 MED ORDER — PHENOL 1.4 % MT LIQD: 2.0000 | OROMUCOSAL | Status: DC | PRN

## 2015-11-13 MED ORDER — PHENYLEPHRINE 40 MCG/ML (10ML) SYRINGE FOR IV PUSH (FOR BLOOD PRESSURE SUPPORT)
PREFILLED_SYRINGE | INTRAVENOUS | Status: AC
  Filled 2015-11-13: qty 10

## 2015-11-13 MED ORDER — MENTHOL 3 MG MT LOZG: 1.0000 | LOZENGE | OROMUCOSAL | Status: DC | PRN

## 2015-11-13 MED ORDER — OXYCODONE HCL 5 MG PO TABS: 5.0000 mg | ORAL_TABLET | Freq: Four times a day (QID) | ORAL | Status: DC | PRN

## 2015-11-13 MED ORDER — ONDANSETRON HCL 4 MG/2ML IJ SOLN
INTRAMUSCULAR | Status: AC
  Filled 2015-11-13: qty 2

## 2015-11-13 MED ORDER — LACTATED RINGERS IV SOLN
INTRAVENOUS | Status: DC
  Administered 2015-11-13: 14:00:00 via INTRAVENOUS
  Administered 2015-11-14: 50 mL/h via INTRAVENOUS
  Administered 2015-11-15 – 2015-11-16 (×3): via INTRAVENOUS

## 2015-11-13 MED ORDER — HYDROMORPHONE HCL 2 MG/ML IJ SOLN
INTRAMUSCULAR | Status: AC
  Filled 2015-11-13: qty 1

## 2015-11-13 MED ORDER — HYDROMORPHONE HCL 1 MG/ML IJ SOLN
INTRAMUSCULAR | Status: AC
  Filled 2015-11-13: qty 1

## 2015-11-13 MED ORDER — LIP MEDEX EX OINT
1.0000 "application " | TOPICAL_OINTMENT | Freq: Two times a day (BID) | CUTANEOUS | Status: DC
  Administered 2015-11-14 – 2015-11-16 (×5): 1 via TOPICAL
  Filled 2015-11-13 (×2): qty 7

## 2015-11-13 MED ORDER — LACTATED RINGERS IR SOLN
Status: DC | PRN
  Administered 2015-11-13: 1000 mL

## 2015-11-13 MED ORDER — CEFOTETAN DISODIUM-DEXTROSE 2-2.08 GM-% IV SOLR
INTRAVENOUS | Status: AC
  Filled 2015-11-13: qty 50

## 2015-11-13 MED ORDER — PHENYLEPHRINE HCL 10 MG/ML IJ SOLN
INTRAMUSCULAR | Status: DC | PRN
  Administered 2015-11-13: 80 ug via INTRAVENOUS

## 2015-11-13 MED ORDER — MIDAZOLAM HCL 5 MG/5ML IJ SOLN
INTRAMUSCULAR | Status: DC | PRN
  Administered 2015-11-13: 2 mg via INTRAVENOUS

## 2015-11-13 MED ORDER — FENTANYL CITRATE (PF) 250 MCG/5ML IJ SOLN
INTRAMUSCULAR | Status: AC
  Filled 2015-11-13: qty 5

## 2015-11-13 MED ORDER — ENOXAPARIN SODIUM 40 MG/0.4ML ~~LOC~~ SOLN
40.0000 mg | SUBCUTANEOUS | Status: DC
  Administered 2015-11-14 – 2015-11-17 (×4): 40 mg via SUBCUTANEOUS
  Filled 2015-11-13 (×5): qty 0.4

## 2015-11-13 MED ORDER — SACCHAROMYCES BOULARDII 250 MG PO CAPS
250.0000 mg | ORAL_CAPSULE | Freq: Two times a day (BID) | ORAL | Status: DC
  Administered 2015-11-13 – 2015-11-16 (×7): 250 mg via ORAL
  Filled 2015-11-13 (×7): qty 1

## 2015-11-13 MED ORDER — LACTATED RINGERS IV BOLUS (SEPSIS): 1000.0000 mL | Freq: Three times a day (TID) | INTRAVENOUS | Status: AC | PRN

## 2015-11-13 MED ORDER — EPHEDRINE SULFATE 50 MG/ML IJ SOLN
INTRAMUSCULAR | Status: DC | PRN
  Administered 2015-11-13: 10 mg via INTRAVENOUS

## 2015-11-13 MED ORDER — DEXTROSE 5 % IV SOLN
2.0000 g | Freq: Two times a day (BID) | INTRAVENOUS | Status: AC
  Administered 2015-11-13: 2 g via INTRAVENOUS
  Filled 2015-11-13 (×2): qty 2

## 2015-11-13 MED ORDER — VITAMIN C 500 MG PO TABS
500.0000 mg | ORAL_TABLET | Freq: Two times a day (BID) | ORAL | Status: DC
  Administered 2015-11-13 – 2015-11-16 (×7): 500 mg via ORAL
  Filled 2015-11-13 (×7): qty 1

## 2015-11-13 MED ORDER — SUGAMMADEX SODIUM 200 MG/2ML IV SOLN
INTRAVENOUS | Status: AC
  Filled 2015-11-13: qty 2

## 2015-11-13 MED ORDER — PROPOFOL 10 MG/ML IV BOLUS
INTRAVENOUS | Status: DC | PRN
  Administered 2015-11-13: 180 mg via INTRAVENOUS

## 2015-11-13 MED ORDER — HYDROMORPHONE HCL 1 MG/ML IJ SOLN
INTRAMUSCULAR | Status: DC | PRN
  Administered 2015-11-13 (×2): 1 mg via INTRAVENOUS

## 2015-11-13 MED ORDER — DEXTROSE 5 % IV SOLN
2.0000 g | INTRAVENOUS | Status: AC
  Administered 2015-11-13: 2 g via INTRAVENOUS
  Filled 2015-11-13: qty 2

## 2015-11-13 MED ORDER — DIPHENHYDRAMINE HCL 50 MG/ML IJ SOLN: 12.5000 mg | Freq: Four times a day (QID) | INTRAMUSCULAR | Status: DC | PRN

## 2015-11-13 MED ORDER — EPHEDRINE SULFATE 50 MG/ML IJ SOLN
INTRAMUSCULAR | Status: AC
  Filled 2015-11-13: qty 1

## 2015-11-13 MED ORDER — MAGIC MOUTHWASH
15.0000 mL | Freq: Four times a day (QID) | ORAL | Status: DC | PRN
  Filled 2015-11-13: qty 15

## 2015-11-13 MED ORDER — KETOROLAC TROMETHAMINE 30 MG/ML IJ SOLN
INTRAMUSCULAR | Status: DC | PRN
  Administered 2015-11-13: 30 mg via INTRAVENOUS

## 2015-11-13 MED ORDER — INDOCYANINE GREEN 25 MG IV SOLR
INTRAVENOUS | Status: DC | PRN
  Administered 2015-11-13: 5 mg via INTRAVENOUS

## 2015-11-13 MED ORDER — FENTANYL CITRATE (PF) 100 MCG/2ML IJ SOLN
INTRAMUSCULAR | Status: DC | PRN
  Administered 2015-11-13: 50 ug via INTRAVENOUS
  Administered 2015-11-13: 100 ug via INTRAVENOUS
  Administered 2015-11-13 (×2): 50 ug via INTRAVENOUS

## 2015-11-13 MED ORDER — LACTATED RINGERS IV SOLN
INTRAVENOUS | Status: DC | PRN
  Administered 2015-11-13 (×3): via INTRAVENOUS

## 2015-11-13 SURGICAL SUPPLY — 97 items
APPLIER CLIP 5 13 M/L LIGAMAX5 (MISCELLANEOUS)
APPLIER CLIP ROT 10 11.4 M/L (STAPLE)
BLADE EXTENDED COATED 6.5IN (ELECTRODE) ×3 IMPLANT
CANNULA REDUC XI 12-8 STAPL (CANNULA) ×1
CANNULA REDUC XI 12-8MM STAPL (CANNULA) ×1
CANNULA REDUCER 12-8 DVNC XI (CANNULA) ×1 IMPLANT
CELLS DAT CNTRL 66122 CELL SVR (MISCELLANEOUS) IMPLANT
CHLORAPREP W/TINT 26ML (MISCELLANEOUS) IMPLANT
CLIP APPLIE 5 13 M/L LIGAMAX5 (MISCELLANEOUS) IMPLANT
CLIP APPLIE ROT 10 11.4 M/L (STAPLE) IMPLANT
CLIP LIGATING HEM O LOK PURPLE (MISCELLANEOUS) IMPLANT
CLIP LIGATING HEMO O LOK GREEN (MISCELLANEOUS) IMPLANT
COUNTER NEEDLE 20 DBL MAG RED (NEEDLE) ×3 IMPLANT
COVER MAYO STAND STRL (DRAPES) ×6 IMPLANT
COVER TIP SHEARS 8 DVNC (MISCELLANEOUS) ×1 IMPLANT
COVER TIP SHEARS 8MM DA VINCI (MISCELLANEOUS) ×2
DECANTER SPIKE VIAL GLASS SM (MISCELLANEOUS) ×3 IMPLANT
DEVICE TROCAR PUNCTURE CLOSURE (ENDOMECHANICALS) IMPLANT
DRAIN CHANNEL 19F RND (DRAIN) ×3 IMPLANT
DRAPE ARM DVNC X/XI (DISPOSABLE) ×4 IMPLANT
DRAPE COLUMN DVNC XI (DISPOSABLE) ×1 IMPLANT
DRAPE DA VINCI XI ARM (DISPOSABLE) ×8
DRAPE DA VINCI XI COLUMN (DISPOSABLE) ×2
DRAPE SURG IRRIG POUCH 19X23 (DRAPES) ×3 IMPLANT
DRSG OPSITE POSTOP 4X10 (GAUZE/BANDAGES/DRESSINGS) IMPLANT
DRSG OPSITE POSTOP 4X6 (GAUZE/BANDAGES/DRESSINGS) IMPLANT
DRSG OPSITE POSTOP 4X8 (GAUZE/BANDAGES/DRESSINGS) IMPLANT
DRSG TEGADERM 2-3/8X2-3/4 SM (GAUZE/BANDAGES/DRESSINGS) ×3 IMPLANT
DRSG TEGADERM 4X4.75 (GAUZE/BANDAGES/DRESSINGS) ×3 IMPLANT
ELECT PENCIL ROCKER SW 15FT (MISCELLANEOUS) ×6 IMPLANT
ELECT REM PT RETURN 15FT ADLT (MISCELLANEOUS) ×3 IMPLANT
ENDOLOOP SUT PDS II  0 18 (SUTURE)
ENDOLOOP SUT PDS II 0 18 (SUTURE) IMPLANT
EVACUATOR SILICONE 100CC (DRAIN) ×3 IMPLANT
GAUZE SPONGE 2X2 8PLY STRL LF (GAUZE/BANDAGES/DRESSINGS) ×1 IMPLANT
GAUZE SPONGE 4X4 12PLY STRL (GAUZE/BANDAGES/DRESSINGS) IMPLANT
GLOVE ECLIPSE 8.0 STRL XLNG CF (GLOVE) ×9 IMPLANT
GLOVE INDICATOR 8.0 STRL GRN (GLOVE) ×9 IMPLANT
GOWN STRL REUS W/TWL XL LVL3 (GOWN DISPOSABLE) ×27 IMPLANT
HOLDER FOLEY CATH W/STRAP (MISCELLANEOUS) ×3 IMPLANT
KIT PROCEDURE DA VINCI SI (MISCELLANEOUS) ×2
KIT PROCEDURE DVNC SI (MISCELLANEOUS) ×1 IMPLANT
LEGGING LITHOTOMY PAIR STRL (DRAPES) ×3 IMPLANT
LUBRICANT JELLY K Y 4OZ (MISCELLANEOUS) ×3 IMPLANT
NEEDLE INSUFFLATION 14GA 120MM (NEEDLE) ×3 IMPLANT
PACK CARDIOVASCULAR III (CUSTOM PROCEDURE TRAY) ×3 IMPLANT
PACK COLON (CUSTOM PROCEDURE TRAY) ×3 IMPLANT
PAD POSITIONING PINK XL (MISCELLANEOUS) ×3 IMPLANT
PORT LAP GEL ALEXIS MED 5-9CM (MISCELLANEOUS) ×3 IMPLANT
RTRCTR WOUND ALEXIS 18CM MED (MISCELLANEOUS)
SCISSORS LAP 5X35 DISP (ENDOMECHANICALS) ×3 IMPLANT
SEAL CANN UNIV 5-8 DVNC XI (MISCELLANEOUS) ×3 IMPLANT
SEAL XI 5MM-8MM UNIVERSAL (MISCELLANEOUS) ×6
SEALER VESSEL DA VINCI XI (MISCELLANEOUS) ×2
SEALER VESSEL EXT DVNC XI (MISCELLANEOUS) ×1 IMPLANT
SET BI-LUMEN FLTR TB AIRSEAL (TUBING) ×3 IMPLANT
SET TUBE IRRIG SUCTION NO TIP (IRRIGATION / IRRIGATOR) ×3 IMPLANT
SLEEVE ADV FIXATION 5X100MM (TROCAR) IMPLANT
SOLUTION ELECTROLUBE (MISCELLANEOUS) ×3 IMPLANT
SPONGE GAUZE 2X2 STER 10/PKG (GAUZE/BANDAGES/DRESSINGS) ×2
STAPLER 45 BLU RELOAD XI (STAPLE) IMPLANT
STAPLER 45 BLUE RELOAD XI (STAPLE)
STAPLER 45 GREEN RELOAD XI (STAPLE) ×4
STAPLER 45 GRN RELOAD XI (STAPLE) ×2 IMPLANT
STAPLER CANNULA SEAL DVNC XI (STAPLE) ×1 IMPLANT
STAPLER CANNULA SEAL XI (STAPLE) ×2
STAPLER SHEATH (SHEATH) ×2
STAPLER SHEATH ENDOWRIST DVNC (SHEATH) ×1 IMPLANT
SUT MNCRL AB 4-0 PS2 18 (SUTURE) ×3 IMPLANT
SUT PDS AB 1 CTX 36 (SUTURE) ×6 IMPLANT
SUT PDS AB 1 TP1 96 (SUTURE) IMPLANT
SUT PDS AB 2-0 CT2 27 (SUTURE) IMPLANT
SUT PROLENE 0 CT 2 (SUTURE) ×6 IMPLANT
SUT PROLENE 2 0 SH DA (SUTURE) ×3 IMPLANT
SUT SILK 2 0 (SUTURE) ×2
SUT SILK 2 0 SH CR/8 (SUTURE) ×3 IMPLANT
SUT SILK 2-0 18XBRD TIE 12 (SUTURE) ×1 IMPLANT
SUT SILK 3 0 (SUTURE) ×2
SUT SILK 3 0 SH CR/8 (SUTURE) ×3 IMPLANT
SUT SILK 3-0 18XBRD TIE 12 (SUTURE) ×1 IMPLANT
SUT V-LOC BARB 180 2/0GR6 GS22 (SUTURE)
SUT VIC AB 3-0 SH 18 (SUTURE) IMPLANT
SUT VIC AB 3-0 SH 27 (SUTURE)
SUT VIC AB 3-0 SH 27XBRD (SUTURE) IMPLANT
SUT VICRYL 0 UR6 27IN ABS (SUTURE) ×6 IMPLANT
SUTURE V-LC BRB 180 2/0GR6GS22 (SUTURE) IMPLANT
SYRINGE 10CC LL (SYRINGE) ×3 IMPLANT
SYS LAPSCP GELPORT 120MM (MISCELLANEOUS)
SYSTEM LAPSCP GELPORT 120MM (MISCELLANEOUS) IMPLANT
TAPE UMBILICAL COTTON 1/8X30 (MISCELLANEOUS) ×3 IMPLANT
TOWEL OR 17X26 10 PK STRL BLUE (TOWEL DISPOSABLE) IMPLANT
TOWEL OR NON WOVEN STRL DISP B (DISPOSABLE) ×3 IMPLANT
TRAY FOLEY W/METER SILVER 14FR (SET/KITS/TRAYS/PACK) IMPLANT
TRAY FOLEY W/METER SILVER 16FR (SET/KITS/TRAYS/PACK) ×3 IMPLANT
TROCAR ADV FIXATION 5X100MM (TROCAR) ×3 IMPLANT
TUBING CONNECTING 10 (TUBING) IMPLANT
TUBING CONNECTING 10' (TUBING)

## 2015-11-13 NOTE — Transfer of Care (Signed)
Immediate Anesthesia Transfer of Care Note  Patient: Jon Frost  Procedure(s) Performed: Procedure(s): XI ROBOT ASSISTED LOW ANTERIOR RESECTION RIGID  PROCTOSCOPY  (N/A)  Patient Location: PACU  Anesthesia Type:General  Level of Consciousness: awake, alert  and oriented  Airway & Oxygen Therapy: Patient Spontanous Breathing and Patient connected to face mask oxygen  Post-op Assessment: Report given to RN and Post -op Vital signs reviewed and stable  Post vital signs: Reviewed and stable  Last Vitals:  Filed Vitals:   11/13/15 0523  BP: 142/93  Pulse: 79  Temp: 36.9 C  Resp: 18    Last Pain: There were no vitals filed for this visit.       Complications: No apparent anesthesia complications

## 2015-11-13 NOTE — Anesthesia Postprocedure Evaluation (Signed)
Anesthesia Post Note  Patient: Jon Frost  Procedure(s) Performed: Procedure(s) (LRB): XI ROBOT ASSISTED LOW ANTERIOR RESECTION RIGID  PROCTOSCOPY  (N/A)  Patient location during evaluation: PACU Anesthesia Type: General Level of consciousness: awake and alert Pain management: pain level controlled Vital Signs Assessment: post-procedure vital signs reviewed and stable Respiratory status: spontaneous breathing, nonlabored ventilation, respiratory function stable and patient connected to nasal cannula oxygen Cardiovascular status: blood pressure returned to baseline and stable Postop Assessment: no signs of nausea or vomiting Anesthetic complications: no    Last Vitals:  Filed Vitals:   11/13/15 1213 11/13/15 1227  BP: 143/91 148/96  Pulse: 89 89  Temp: 36.7 C 36.6 C  Resp: 15 15    Last Pain:  Filed Vitals:   11/13/15 1244  PainSc: 2                  Keerthana Vanrossum J

## 2015-11-13 NOTE — Op Note (Signed)
11/13/2015  11:30 AM  PATIENT:  Jon Frost  55 y.o. male  Patient Care Team: Marton Redwood, MD as PCP - General (Internal Medicine) Michael Boston, MD as Consulting Physician (General Surgery) Clarene Essex, MD as Consulting Physician (Gastroenterology)  PRE-OPERATIVE DIAGNOSIS:  Rectosigmoid cancer   POST-OPERATIVE DIAGNOSIS:  Proximal rectal cancer   PROCEDURE:  XI ROBOT ASSISTED LOW ANTERIOR RESECTION  RIGID PROCTOSCOPY   SURGEON:  Michael Boston, MD  ASSISTANT: Leighton Ruff, MD   ANESTHESIA:   local and general  EBL:  Total I/O In: [id number] [I.V.:2700] Out: 150 [Urine:100; Blood:50]  Delay start of Pharmacological VTE agent (>24hrs) due to surgical blood loss or risk of bleeding:  no  DRAINS: (19Fr) Blake drain(s) in the pelvis   SPECIMENS:   1.  RECTOSIGMOID COLON (open end proximal.  Perforated ex vivo = after extracted) 2.  DISTAL ANASTOMOTIC RING = FINAL DISTAL MARGIN  DISPOSITION OF SPECIMEN:  PATHOLOGY  COUNTS:  YES  PLAN OF CARE: Admit to inpatient   PATIENT DISPOSITION:  PACU - hemodynamically stable.  INDICATION:    Patient found to have near obstructing tumor of rectosigmoid colon by colonoscopy.  I recommended segmental resection:  The anatomy & physiology of the digestive tract was discussed.  The pathophysiology was discussed.  Natural history risks without surgery was discussed.   I worked to give an overview of the disease and the frequent need to have multispecialty involvement.  I feel the risks of no intervention will lead to serious problems that outweigh the operative risks; therefore, I recommended a partial colectomy to remove the pathology.  Laparoscopic & open techniques were discussed.   Risks such as bleeding, infection, abscess, leak, reoperation, possible ostomy, hernia, heart attack, death, and other risks were discussed.  I noted a good likelihood this will help address the problem.   Goals of post-operative recovery were discussed as well.   We will work to minimize complications.  Educational materials on the pathology had been given in the office.  Questions were answered.    The patient expressed understanding & wished to proceed with surgery.  OR FINDINGS:   Patient had a bulky tumor in the proximal rectum.  No evidence of perforation or carcinomatosis.  No obvious metastatic disease on visceral parietal peritoneum or liver.  The anastomosis rests 9-10 cm from the anal verge by rigid proctoscopy.  DESCRIPTION:   Informed consent was confirmed.  The patient underwent general anaesthesia without difficulty.  The patient was positioned appropriately.  VTE prevention in place.  The patient's abdomen was clipped, prepped, & draped in a sterile fashion.  Surgical timeout confirmed our plan.  The patient was positioned in reverse Trendelenburg.  Abdominal entry was gained using Varess technique with a trach hook on the anterior abdominal wall fascia in the right upper abdomen.  Entry was clean.  I induced carbon dioxide insufflation.  Camera inspection revealed no injury.  Extra ports were carefully placed under direct laparoscopic visualization.  I reflected the greater omentum and the upper abdomen the small bowel in the upper abdomen.  The patient was carefully positioned.  The Intuitive daVinci robot was carefully docked with camera & instruments carefully placed.  The patient had a bulky mass in the proximal rectum.  Initially cannot seen a good tattooing but then saw some on the right mesorectum later.  I scored the base of peritoneum of the medial side of the mesentery of the left colon from the ligament of Treitz to the peritoneal reflection  of the mid rectum.   I elevated the sigmoid mesentery and entered into the retro-mesenteric plane. We were able to identify the left ureter and gonadal vessels. We kept those posterior within the retroperitoneum and elevated the left colon mesentery off that. I did isolate the inferior  mesenteric artery (IMA) pedicle but did not ligate it yet.  I continued distally and got into the avascular plane posterior to the mesorectum. This allowed me to help mobilize the rectum as well by freeing the mesorectum off the sacrum.  I mobilized the peritoneal coverings towards the peritoneal reflection on both the right and left sides of the rectum.  I stayed away from the right and left ureters.   And up having to come through the anterior peritoneal reflection and also mobilized the anterior rectum off the seminal vesicles and prostate to get good mobility.  I kept the lateral vascular pedicles to the rectum intact.   I skeletonized the lymph nodes off the inferior mesenteric artery pedicle.  I went down to its takeoff from the aorta.  I isolated the inferior mesenteric vein off of the ligament of Treitz just cephalad to that as well.  After confirming the left ureter was out of the way, I went ahead and ligated the inferior mesenteric artery pedicle just near its takeoff from the aorta.  I did ligate the inferior mesenteric vein in a similar fashion superior to the ligament of Treitz.  We ensured hemostasis. I skeletonized the mesorectum at the junction at the proximal rectum for the distal point of resection.  I mobilized the left colon in a lateral to medial fashion off the line of Toldt up towards the splenic flexure to ensure good mobilization of the remaining left colon to reach into the pelvis.  I chose a region at the descending/sigmoid junction that was soft and easily reached down to the rectal stump.  I transected the mesentery of the colon radially just proximal to the IMA/IMV pedicle to preserve remaining colon blood supply.  We placed IV fluorescent Firefly and observed good blood flow to the ascending colon at the premarked area.  Saw good supply in the rectal stump distal to the skeletonized mesorectum.I skeletonized at the mid mesorectum and transected at the proximal rectum using a  robotic 45 mm stapler.  His tissues were thick so I used a green load. First firing went 80% across.  Needed a second firing for the last 20%.    I placed a wound protector through a Pfannenstiel incision in the suprapubic region, taking care to avoid bladder injury.  I was able to eviscerate the rectosigmoid and descending colon out the wound.   I clamped the colon proximal to this area using a soft bowel clamp. I transected at the descending/sigmoid junction with a scalpel. I got healthy bleeding mucosa.  We sent the rectosigmoid colon specimen off to go to pathology.   After was eviscerated, we reinspected the cancer.  At least 4 cm distal margin grossly.  The anterior wall perforated very easily consistent with a very thin aspect of the cancer.  After changing gloves, we sized the colon orifice.  I chose a 33 EEA anvil stapler system. I placed the anvil to the open end of the proximal remaining colon and closed around it using a 0 Prolene pursestring.  We did copious irrigation with crystalloid solution.  Hemostasis was good.  The distal end of the remaining colon easily reached down to the rectal stump, therefore, splenic flexure  mobilization was not needed.      Dr Marcello Moores scrubbed down and did gentle anal dilation and advanced the EEA stapler up the rectal stump. The spike was brought out at the provimal end of the rectal stump under direct visualization.  I attached the anvil of the proximal colon the spike of the stapler. Anvil was tightened down and held clamped for 60 seconds. The EEA stapler was fired and held clamped for 30 seconds. The stapler was released & removed. We noted 2 excellent anastomotic rings. Blue stitch is in the proximal ring.  Dr Marcello Moores did rigid proctoscopy noted the anastomosis was at 9-10 cm from the anal verge consistent with the proximal rectum.  We did a final irrigation of antibiotic solution (900 mg clindamycin/240 mg gentamicin in a liter of crystalloid) & held that for the  pelvic air leak test .  The rectum was insufflated the rectum while clamping the colon proximal to that anastomosis.  There was a negative air leak test. There was no tension of mesentery or bowel at the anastomosis.   Tissues looked viable.  Ureters & bowel uninjured.  The anastomosis looked healthy.  Endoluminal gas was evacuated.  Ports & wound protector removed.  We changed gloves & redraped the patient per colon SSI prevention protocol.  We aspirated the antibiotic irrigation.  Hemostasis was good.  Sterile unused instruments were used from this point.  I closed the 51mm port sites using Monocryl stitch and sterile dressing.  I closed the extraction wound using a 0 Vicryl vertical retrorectus/peritoneal closure and a #1 PDS transverse anterior rectal fascial closure like a small Pfannenstiel closure. I closed the skin with some interrupted Monocryl stitches. I placed antibiotic-soaked wicks into the closure at the corners x2.  I placed sterile dressings.     Patient is being extubated go to recovery room. I had discussed postop care with the patient in detail the office & in the holding area. Instructions are written.  I discussed operative findings, updated the patient's status, discussed probable steps to recovery, and gave postoperative recommendations to the patient's spouse.  Recommendations were made.  Questions were answered.  She expressed understanding & appreciation.   [name], M.D., F.A.C.S. Gastrointestinal and Minimally Invasive Surgery [facility] [address], [place], [place] [address] [phone] Main / Paging

## 2015-11-13 NOTE — Anesthesia Procedure Notes (Signed)
Procedure Name: Intubation Date/Time: 11/13/2015 7:37 AM Performed by: Noralyn Pick D Pre-anesthesia Checklist: Patient identified, Emergency Drugs available, Suction available and Patient being monitored Patient Re-evaluated:Patient Re-evaluated prior to inductionOxygen Delivery Method: Circle system utilized Preoxygenation: Pre-oxygenation with 100% oxygen Intubation Type: IV induction Ventilation: Mask ventilation without difficulty Laryngoscope Size: Mac and 4 Grade View: Grade I Tube type: Oral Tube size: 7.5 mm Number of attempts: 1 Airway Equipment and Method: Stylet and Oral airway Placement Confirmation: ETT inserted through vocal cords under direct vision,  positive ETCO2 and breath sounds checked- equal and bilateral Secured at: 22 cm Tube secured with: Tape Dental Injury: Teeth and Oropharynx as per pre-operative assessment

## 2015-11-13 NOTE — H&P (Addendum)
Jon Frost  Location: [facility] Patient #: [id number] DOB: March 30, 1961 Married / Language: English / Race: White Male   History of Present Illness  The patient is a 55 year old male who presents with colorectal cancer. Note for "Colorectal cancer": Patient sent for surgical consultation by Dr. Clarene Essex for concern of cancer at rectosigmoid junction. Initially sent to [facility] Grammes since patient's wife had surgery by him. Dr. Donne Hazel referred to myself given my higher colorectal surgical volume.  Pleasant obese male. Comes today with his wife. Underwent screening colonoscopy. Found to have partially obstructing mass in distal sigmoid colon. 4 cm long. Not able to pass standard endoscope. Was able to get pediatric endoscope. Small polyps x 2 noted in descending and transverse colon. Not able to get to the hepatic flexure. Recommendation made for surgical consultation as biopsy consistent with adenocarcinoma. CT of abdomen and pelvis show no evidence of any metastatic disease. CEA elevated at 21.  Patient comes in today with his wife. He normally can walk an hour without difficulty. He's never had abdominal surgery. Usually has a bowel movement every day. However he notes that the calibers or not normal. He thinks are coming out in smaller pieces and may be more narrow. Not really rabbit pellets style. He recalls being told he had diverticulosis on a CAT scan for kidney stones about 5 years ago. No other abdominal issues. He does not smoke.  No personal nor family history of GI/colon cancer, inflammatory bowel disease, irritable bowel syndrome, allergy such as Celiac Sprue, dietary/dairy problems, colitis, ulcers nor gastritis. No recent sick contacts/gastroenteritis. No travel outside the country. No changes in diet. No dysphagia to solids or liquids. No significant heartburn or reflux. No hematochezia, hematemesis, coffee ground emesis. No evidence of prior  gastric/peptic ulceration.  I have re-reviewed the the patient's records, history, medications, and allergies.  I have re-examined the patient.  I again discussed intraoperative plans and goals of post-operative recovery.  The patient agrees to proceed.   Allergies (Sonya Bynum, CMA; 10/13/2015 4:04 PM) No Known Drug Allergies06/05/2015  Medication History (Sonya Bynum, CMA; 10/13/2015 4:04 PM) HydroCHLOROthiazide (25MG  Tablet, 1 (one) Tablet Oral daily, Taken starting 10/09/2015) Active. Medications Reconciled  Vitals (Sonya Bynum CMA; 10/13/2015 4:03 PM) 10/13/2015 4:03 PM Weight: 220 lb Height: 71in Body Surface Area: 2.2 m Body Mass Index: 30.68 kg/m  Temp.: 73F(Temporal)  Pulse: 79 (Regular)  BP: 126/80 (Sitting, Left Arm, Standard)       Physical Exam [name] MD; 10/13/2015 4:39 PM) General Mental Status-Alert. General Appearance-Not in acute distress, Not Sickly. Orientation-Oriented X3. Hydration-Well hydrated. Voice-Normal.  Integumentary Global Assessment Upon inspection and palpation of skin surfaces of the - Axillae: non-tender, no inflammation or ulceration, no drainage. and Distribution of scalp and body hair is normal. General Characteristics Temperature - normal warmth is noted.  Head and Neck Head-normocephalic, atraumatic with no lesions or palpable masses. Face Global Assessment - atraumatic, no absence of expression. Neck Global Assessment - no abnormal movements, no bruit auscultated on the right, no bruit auscultated on the left, no decreased range of motion, non-tender. Trachea-midline. Thyroid Gland Characteristics - non-tender.  Eye Eyeball - Left-Extraocular movements intact, No Nystagmus. Eyeball - Right-Extraocular movements intact, No Nystagmus. Cornea - Left-No Hazy. Cornea - Right-No Hazy. Sclera/Conjunctiva - Left-No scleral icterus, No Discharge. Sclera/Conjunctiva - Right-No scleral  icterus, No Discharge. Pupil - Left-Direct reaction to light normal. Pupil - Right-Direct reaction to light normal.  ENMT Ears Pinna - Left -  no drainage observed, no generalized tenderness observed. Right - no drainage observed, no generalized tenderness observed. Nose and Sinuses External Inspection of the Nose - no destructive lesion observed. Inspection of the nares - Left - quiet respiration. Right - quiet respiration. Mouth and Throat Lips - Upper Lip - no fissures observed, no pallor noted. Lower Lip - no fissures observed, no pallor noted. Nasopharynx - no discharge present. Oral Cavity/Oropharynx - Tongue - no dryness observed. Oral Mucosa - no cyanosis observed. Hypopharynx - no evidence of airway distress observed.  Chest and Lung Exam Inspection Movements - Normal and Symmetrical. Accessory muscles - No use of accessory muscles in breathing. Palpation Palpation of the chest reveals - Non-tender. Auscultation Breath sounds - Normal and Clear.  Cardiovascular Auscultation Rhythm - Regular. Murmurs & Other Heart Sounds - Auscultation of the heart reveals - No Murmurs and No Systolic Clicks.  Abdomen Inspection Inspection of the abdomen reveals - No Visible peristalsis and No Abnormal pulsations. Umbilicus - No Bleeding, No Urine drainage. Palpation/Percussion Palpation and Percussion of the abdomen reveal - Soft, Non Tender, No Rebound tenderness, No Rigidity (guarding) and No Cutaneous hyperesthesia. Note: Obese but soft. Moderate suprapubic umbilical diastases recti. No umbilical hernia. Nontender nondistended.   Male Genitourinary Sexual Maturity Tanner 5 - Adult hair pattern and Adult penile size and shape. Note: Normal external genitalia. No inguinal hernias. No lymphadenopathy.   Peripheral Vascular Upper Extremity Inspection - Left - No Cyanotic nailbeds, Not Ischemic. Right - No Cyanotic nailbeds, Not Ischemic.  Neurologic Neurologic evaluation  reveals -normal attention span and ability to concentrate, able to name objects and repeat phrases. Appropriate fund of knowledge , normal sensation and normal coordination. Mental Status Affect - not angry, not paranoid. Cranial Nerves-Normal Bilaterally. Gait-Normal.  Neuropsychiatric Mental status exam performed with findings of-able to articulate well with normal speech/language, rate, volume and coherence, thought content normal with ability to perform basic computations and apply abstract reasoning and no evidence of hallucinations, delusions, obsessions or homicidal/suicidal ideation. Note: Quiet. Somewhat grim face.   Musculoskeletal Global Assessment Spine, Ribs and Pelvis - no instability, subluxation or laxity. Right Upper Extremity - no instability, subluxation or laxity.  Lymphatic Head & Neck  General Head & Neck Lymphatics: Bilateral - Description - No Localized lymphadenopathy. Axillary  General Axillary Region: Bilateral - Description - No Localized lymphadenopathy. Femoral & Inguinal  Generalized Femoral & Inguinal Lymphatics: Left - Description - No Localized lymphadenopathy. Right - Description - No Localized lymphadenopathy.    Assessment & Plan  CARCINOMA OF RECTOSIGMOID (COLON) (C19) Impression: Ulcerated partially obstructing rectosigmoid mass. 4 cm long. Tattooed. I think this would benefit from removal. Reasonable robotic sigmoid colectomy/low anterior resection.  Discuss with them at length. He seemed guarded and skeptical at first but they declared they felt very comfortable with our group and he was comfortable with me being his surgeon. They wish to proceed. He is hoping to get it done before the end of the month.  Patient needs CT scan of chest for completion of workup. CEA is elevated but no evidence of abdominal or pelvic disease outside of the tumor.  I went over recovery expectations. Until people usually make out less than a week.  Patient is confident he'll be out in a couple days. I cautioned him against being overly optimistic, but I will work to try and help him have a speedy and nonproblematic recovering  POLYP OF TRANSVERSE COLON, UNSPECIFIED TYPE (D12.3) Impression: Probable polyp of transverse & descending colon.  Not able to get proximal to the proximal transverse colon according to Dr. Perley Jain notes. I think theywere left in place. CT scan does not see any obvious endoluminal polyps or defects to my view, so I doubt there is a cancer or large polyp proximal to where he looked.  I think Dr Perley Jain plan is to do a completion follow-up colonoscopy 6 months postop and polypectomies at that time. Patient and wife not certain. Not able to reach Dr. Watt Climes today as he is out of the country. However, was able to reach his nurse, Pamala Hurry. She confirmed the plan was to do follow-up colonoscopy after cancer resection to remove remaining polyps. Probably will do it in 6 months.  POLYP OF DESCENDING COLON, UNSPECIFIED TYPE (D12.4) ENCOUNTER FOR PREOPERATIVE EXAMINATION FOR GENERAL SURGICAL PROCEDURE (Z01.818) Current Plans You are being scheduled for surgery - Our schedulers will call you.  You should hear from our office's scheduling department within 5 working days about the location, date, and time of surgery. We try to make accommodations for patient's preferences in scheduling surgery, but sometimes the OR schedule or the surgeon's schedule prevents [place] from making those accommodations.  If you have not heard from our office [phone]) in 5 working days, call the office and ask for your surgeon's nurse.  If you have other questions about your diagnosis, plan, or surgery, call the office and ask for your surgeon's nurse.  Written instructions provided Pt Education - [facility] Colon Bowel Prep 2015 Miralax/Antibiotics Started Neomycin Sulfate 500MG , 2 (two) Tablet SEE NOTE, #6, 10/13/2015, No Refill. Local Order: TAKE TWO  TABLETS AT 2 PM, 3 PM, AND 10 PM THE DAY PRIOR TO SURGERY Started Flagyl 500MG , 2 (two) Tablet SEE NOTE, #6, 10/13/2015, No Refill. Local Order: Take at 2pm, 3pm, and 10pm the day prior to your colon operation Pt Education - [facility] Colectomy post-op instructions: discussed with patient and provided information. Pt Education - Pamphlet Given - Laparoscopic Colorectal Surgery: discussed with patient and provided information. Pt Education - [facility] Good Bowel Health (Thayer Inabinet) Pt Education - [facility] Pain Control (Arien Morine)   Signed by [name], MD  [name], M.D., F.A.C.S. Gastrointestinal and Minimally Invasive Surgery [facility] [address] [place], [place] [address] [phone] Main / Paging

## 2015-11-13 NOTE — Discharge Instructions (Signed)
SURGERY: POST OP INSTRUCTIONS °(Surgery for small bowel obstruction, colon resection, etc) ° ° °###################################################################### ° °EAT °Gradually transition to a high fiber diet with a fiber supplement over the next few days after discharge ° °WALK °Walk an hour a day.  Control your pain to do that.   ° °CONTROL PAIN °Control pain so that you can walk, sleep, tolerate sneezing/coughing, go up/down stairs. ° °HAVE A BOWEL MOVEMENT DAILY °Keep your bowels regular to avoid problems.  OK to try a laxative to override constipation.  OK to use an antidairrheal to slow down diarrhea.  Call if not better after 2 tries ° °CALL IF YOU HAVE PROBLEMS/CONCERNS °Call if you are still struggling despite following these instructions. °Call if you have concerns not answered by these instructions ° °###################################################################### ° ° °DIET °Follow a light diet the first few days at home.  Start with a bland diet such as soups, liquids, starchy foods, low fat foods, etc.  If you feel full, bloated, or constipated, stay on a ful liquid or pureed/blenderized diet for a few days until you feel better and no longer constipated. °Be sure to drink plenty of fluids every day to avoid getting dehydrated (feeling dizzy, not urinating, etc.). °Gradually add a fiber supplement to your diet over the next week.  Gradually get back to a regular solid diet.  Avoid fast food or heavy meals the first week as you are more likely to get nauseated. °It is expected for your digestive tract to need a few months to get back to normal.  It is common for your bowel movements and stools to be irregular.  You will have occasional bloating and cramping that should eventually fade away.  Until you are eating solid food normally, off all pain medications, and back to regular activities; your bowels will not be normal. °Focus on eating a low-fat, high fiber diet the rest of your life  (See Getting to Good Bowel Health, below). ° °CARE of your INCISION or WOUND °It is good for closed incision and even open wounds to be washed every day.  Shower every day.  Short baths are fine.  Wash the incisions and wounds clean with soap & water.    °If you have a closed incision(s), wash the incision with soap & water every day.  You may leave closed incisions open to air if it is dry.   You may cover the incision with clean gauze & replace it after your daily shower for comfort. °If you have skin tapes (Steristrips) or skin glue (Dermabond) on your incision, leave them in place.  They will fall off on their own like a scab.  You may trim any edges that curl up with clean scissors.  If you have staples, set up an appointment for them to be removed in the office in 10 days after surgery.  °If you have a drain, wash around the skin exit site with soap & water and place a new dressing of gauze or band aid around the skin every day.  Keep the drain site clean & dry.    °If you have an open wound with packing, see wound care instructions.  In general, it is encouraged that you remove your dressing and packing, shower with soap & water, and replace your dressing once a day.  Pack the wound with clean gauze moistened with normal (0.9%) saline to keep the wound moist & uninfected.  Pressure on the dressing for 30 minutes will stop most wound   bleeding.  Eventually your body will heal & pull the open wound closed over the next few months.  °Raw open wounds will occasionally bleed or secrete yellow drainage until it heals closed.  Drain sites will drain a little until the drain is removed.  Even closed incisions can have mild bleeding or drainage the first few days until the skin edges scab over & seal.   °If you have an open wound with a wound vac, see wound vac care instructions. ° ° ° ° °ACTIVITIES as tolerated °Start light daily activities --- self-care, walking, climbing stairs-- beginning the day after surgery.   Gradually increase activities as tolerated.  Control your pain to be active.  Stop when you are tired.  Ideally, walk several times a day, eventually an hour a day.   °Most people are back to most day-to-day activities in a few weeks.  It takes 4-8 weeks to get back to unrestricted, intense activity. °If you can walk 30 minutes without difficulty, it is safe to try more intense activity such as jogging, treadmill, bicycling, low-impact aerobics, swimming, etc. °Save the most intensive and strenuous activity for last (Usually 4-8 weeks after surgery) such as sit-ups, heavy lifting, contact sports, etc.  Refrain from any intense heavy lifting or straining until you are off narcotics for pain control.  You will have off days, but things should improve week-by-week. °DO NOT PUSH THROUGH PAIN.  Let pain be your guide: If it hurts to do something, don't do it.  Pain is your body warning you to avoid that activity for another week until the pain goes down. °You may drive when you are no longer taking narcotic prescription pain medication, you can comfortably wear a seatbelt, and you can safely make sudden turns/stops to protect yourself without hesitating due to pain. °You may have sexual intercourse when it is comfortable. If it hurts to do something, stop. ° °MEDICATIONS °Take your usually prescribed home medications unless otherwise directed.   °Blood thinners:  °Usually you can restart any strong blood thinners after the second postoperative day.  It is OK to take aspirin right away.    ° If you are on strong blood thinners (warfarin/Coumadin, Plavix, Xerelto, Eliquis, Pradaxa, etc), discuss with your surgeon, medicine PCP, and/or cardiologist for instructions on when to restart the blood thinner & if blood monitoring is needed (PT/INR blood check, etc).   ° ° °PAIN CONTROL °Pain after surgery or related to activity is often due to strain/injury to muscle, tendon, nerves and/or incisions.  This pain is usually  short-term and will improve in a few months.  °To help speed the process of healing and to get back to regular activity more quickly, DO THE FOLLOWING THINGS TOGETHER: °1. Increase activity gradually.  DO NOT PUSH THROUGH PAIN °2. Use Ice and/or Heat °3. Try Gentle Massage and/or Stretching °4. Take over the counter pain medication °5. Take Narcotic prescription pain medication for more severe pain ° °Good pain control = faster recovery.  It is better to take more medicine to be more active than to stay in bed all day to avoid medications. °1.  Increase activity gradually °Avoid heavy lifting at first, then increase to lifting as tolerated over the next 6 weeks. °Do not “push through” the pain.  Listen to your body and avoid positions and maneuvers than reproduce the pain.  Wait a few days before trying something more intense °Walking an hour a day is encouraged to help your body recover faster   and more safely.  Start slowly and stop when getting sore.  If you can walk 30 minutes without stopping or pain, you can try more intense activity (running, jogging, aerobics, cycling, swimming, treadmill, sex, sports, weightlifting, etc.) °Remember: If it hurts to do it, then don’t do it! °2. Use Ice and/or Heat °You will have swelling and bruising around the incisions.  This will take several weeks to resolve. °Ice packs or heating pads (6-8 times a day, 30-60 minutes at a time) will help sooth soreness & bruising. °Some people prefer to use ice alone, heat alone, or alternate between ice & heat.  Experiment and see what works best for you.  Consider trying ice for the first few days to help decrease swelling and bruising; then, switch to heat to help relax sore spots and speed recovery. °Shower every day.  Short baths are fine.  It feels good!  Keep the incisions and wounds clean with soap & water.   °3. Try Gentle Massage and/or Stretching °Massage at the area of pain many times a day °Stop if you feel pain - do not  overdo it °4. Take over the counter pain medication °This helps the muscle and nerve tissues become less irritable and calm down faster °Choose ONE of the following over-the-counter anti-inflammatory medications: °Acetaminophen 500mg tabs (Tylenol) 1-2 pills with every meal and just before bedtime (avoid if you have liver problems or if you have acetaminophen in you narcotic prescription) °Naproxen 220mg tabs (ex. Aleve, Naprosyn) 1-2 pills twice a day (avoid if you have kidney, stomach, IBD, or bleeding problems) °Ibuprofen 200mg tabs (ex. Advil, Motrin) 3-4 pills with every meal and just before bedtime (avoid if you have kidney, stomach, IBD, or bleeding problems) °Take with food/snack several times a day as directed for at least 2 weeks to help keep pain / soreness down & more manageable. °5. Take Narcotic prescription pain medication for more severe pain °A prescription for strong pain control is often given to you upon discharge (for example: oxycodone/Percocet, hydrocodone/Norco/Vicodin, or tramadol/Ultram) °Take your pain medication as prescribed. °Be mindful that most narcotic prescriptions contain Tylenol (acetaminophen) as well - avoid taking too much Tylenol. °If you are having problems/concerns with the prescription medicine (does not control pain, nausea, vomiting, rash, itching, etc.), please call us [phone] to see if we need to switch you to a different pain medicine that will work better for you and/or control your side effects better. °If you need a refill on your pain medication, you must call the office before 4 pm and on weekdays only.  By federal law, prescriptions for narcotics cannot be called into a pharmacy.  They must be filled out on paper & picked up from our office by the patient or authorized caretaker.  Prescriptions cannot be filled after 4 pm nor on weekends.   ° °WHEN TO CALL US [phone] °Severe uncontrolled or worsening pain  °Fever over 101 F (38.5 C) °Concerns with  the incision: Worsening pain, redness, rash/hives, swelling, bleeding, or drainage °Reactions / problems with new medications (itching, rash, hives, nausea, etc.) °Nausea and/or vomiting °Difficulty urinating °Difficulty breathing °Worsening fatigue, dizziness, lightheadedness, blurred vision °Other concerns °If you are not getting better after two weeks or are noticing you are getting worse, contact our office [phone] for further advice.  We may need to adjust your medications, re-evaluate you in the office, send you to the emergency room, or see what other things we can do to help. °The   clinic staff is available to answer your questions during regular business hours (8:30am-5pm).  Please don’t hesitate to call and ask to speak to one of our nurses for clinical concerns.    °A surgeon from [facility] is always on call at the hospitals 24 hours/day °If you have a medical emergency, go to the nearest emergency room or call 911. ° °FOLLOW UP in our office °One the day of your discharge from the hospital (or the next business weekday), please call [facility] to set up or confirm an appointment to see your surgeon in the office for a follow-up appointment.  Usually it is 2-3 weeks after your surgery.   °If you have skin staples at your incision(s), let the office know so we can set up a time in the office for the nurse to remove them (usually around 10 days after surgery). °Make sure that you call for appointments the day of discharge (or the next business weekday) from the hospital to ensure a convenient appointment time. °IF YOU HAVE DISABILITY OR FAMILY LEAVE FORMS, BRING THEM TO THE OFFICE FOR PROCESSING.  DO NOT GIVE THEM TO YOUR DOCTOR. ° °[facility] °[address], [address], [place], [place]  [address] ? °[phone] - Main °[phone] - Toll Free,  [phone] - Fax °[email or web address] ° °GETTING TO GOOD BOWEL HEALTH. °It is  expected for your digestive tract to need a few months to get back to normal.  It is common for your bowel movements and stools to be irregular.  You will have occasional bloating and cramping that should eventually fade away.  Until you are eating solid food normally, off all pain medications, and back to regular activities; your bowels will not be normal.   °Avoiding constipation °The goal: ONE SOFT BOWEL MOVEMENT A DAY!    °Drink plenty of fluids.  Choose water first. °TAKE A FIBER SUPPLEMENT EVERY DAY THE REST OF YOUR LIFE °During your first week back home, gradually add back a fiber supplement every day °Experiment which form you can tolerate.   There are many forms such as powders, tablets, wafers, gummies, etc °Psyllium bran (Metamucil), methylcellulose (Citrucel), Miralax or Glycolax, Benefiber, Flax Seed.  °Adjust the dose week-by-week (1/2 dose/day to 6 doses a day) until you are moving your bowels 1-2 times a day.  Cut back the dose or try a different fiber product if it is giving you problems such as diarrhea or bloating. °Sometimes a laxative is needed to help jump-start bowels if constipated until the fiber supplement can help regulate your bowels.  If you are tolerating eating & you are farting, it is okay to try a gentle laxative such as double dose MiraLax, prune juice, or Milk of Magnesia.  Avoid using laxatives too often. °Stool softeners can sometimes help counteract the constipating effects of narcotic pain medicines.  It can also cause diarrhea, so avoid using for too long. °If you are still constipated despite taking fiber daily, eating solids, and a few doses of laxatives, call our office. °Controlling diarrhea °Try drinking liquids and eating bland foods for a few days to avoid stressing your intestines further. °Avoid dairy products (especially milk & ice cream) for a short time.  The intestines often can lose the ability to digest lactose when stressed. °Avoid foods that cause gassiness or  bloating.  Typical foods include beans and other legumes, cabbage, broccoli, and dairy foods.  Avoid greasy, spicy, fast foods.  Every person has   some sensitivity to other foods, so listen to your body and avoid those foods that trigger problems for you. °Probiotics (such as active yogurt, Align, etc) may help repopulate the intestines and colon with normal bacteria and calm down a sensitive digestive tract °Adding a fiber supplement gradually can help thicken stools by absorbing excess fluid and retrain the intestines to act more normally.  Slowly increase the dose over a few weeks.  Too much fiber too soon can backfire and cause cramping & bloating. °It is okay to try and slow down diarrhea with a few doses of antidiarrheal medicines.   °Bismuth subsalicylate (ex. Kayopectate, Pepto Bismol) for a few doses can help control diarrhea.  Avoid if pregnant.   °Loperamide (Imodium) can slow down diarrhea.  Start with one tablet (2mg) first.  Avoid if you are having fevers or severe pain.  °ILEOSTOMY PATIENTS WILL HAVE CHRONIC DIARRHEA since their colon is not in use.    °Drink plenty of liquids.  You will need to drink even more glasses of water/liquid a day to avoid getting dehydrated. °Record output from your ileostomy.  Expect to empty the bag every 3-4 hours at first.  Most people with a permanent ileostomy empty their bag 4-6 times at the least.   °Use antidiarrheal medicine (especially Imodium) several times a day to avoid getting dehydrated.  Start with a dose at bedtime & breakfast.  Adjust up or down as needed.  Increase antidiarrheal medications as directed to avoid emptying the bag more than 8 times a day (every 3 hours). °Work with your wound ostomy nurse to learn care for your ostomy.  See ostomy care instructions. °TROUBLESHOOTING IRREGULAR BOWELS °1) Start with a soft & bland diet. No spicy, greasy, or fried foods.  °2) Avoid gluten/wheat or dairy products from diet to see if symptoms improve. °3) Miralax  17gm or flax seed mixed in 8oz. water or juice-daily. May use 2-4 times a day as needed. °4) Gas-X, Phazyme, etc. as needed for gas & bloating.  °5) Prilosec (omeprazole) over-the-counter as needed °6)  Consider probiotics (Align, Activa, etc) to help calm the bowels down ° °Call your doctor if you are getting worse or not getting better.  Sometimes further testing (cultures, endoscopy, X-ray studies, CT scans, bloodwork, etc.) may be needed to help diagnose and treat the cause of the diarrhea. °[facility] °[address], [address], [place], [place]  [address] °[phone] - Main.    °[phone]  - Toll Free.   [phone] - Fax °[email or web address] ° ° °

## 2015-11-14 LAB — CBC
HEMATOCRIT: 37.5 % — AB (ref 39.0–52.0)
Hemoglobin: 13.1 g/dL (ref 13.0–17.0)
MCH: 32.5 pg (ref 26.0–34.0)
MCHC: 34.9 g/dL (ref 30.0–36.0)
MCV: 93.1 fL (ref 78.0–100.0)
Platelets: 169 10*3/uL (ref 150–400)
RBC: 4.03 MIL/uL — ABNORMAL LOW (ref 4.22–5.81)
RDW: 13.1 % (ref 11.5–15.5)
WBC: 11 10*3/uL — ABNORMAL HIGH (ref 4.0–10.5)

## 2015-11-14 LAB — BASIC METABOLIC PANEL WITH GFR
Anion gap: 5 (ref 5–15)
BUN: 19 mg/dL (ref 6–20)
CO2: 29 mmol/L (ref 22–32)
Calcium: 8.8 mg/dL — ABNORMAL LOW (ref 8.9–10.3)
Chloride: 103 mmol/L (ref 101–111)
Creatinine, Ser: 1.06 mg/dL (ref 0.61–1.24)
GFR calc Af Amer: >60 mL/min
GFR calc non Af Amer: >60 mL/min
Glucose, Bld: 116 mg/dL — ABNORMAL HIGH (ref 65–99)
Potassium: 4.1 mmol/L (ref 3.5–5.1)
Sodium: 137 mmol/L (ref 135–145)

## 2015-11-14 LAB — MAGNESIUM: Magnesium: 2.1 mg/dL (ref 1.7–2.4)

## 2015-11-14 MED ORDER — HYDROCHLOROTHIAZIDE 25 MG PO TABS
25.0000 mg | ORAL_TABLET | Freq: Every day | ORAL | Status: DC
  Administered 2015-11-14 – 2015-11-16 (×3): 25 mg via ORAL
  Filled 2015-11-14 (×3): qty 1

## 2015-11-14 NOTE — Progress Notes (Signed)
1 Day Post-Op  Subjective: No flatus yet, no n/v, doing very well this am, has already been ambulating  Objective: Vital signs in last 24 hours: Temp:  [97.6 F (36.4 C)-99 F (37.2 C)] 98.1 F (36.7 C) (07/07 0659) Pulse Rate:  [65-100] 65 (07/07 0659) Resp:  [12-16] 16 (07/07 0659) BP: (104-154)/(81-96) 129/84 mmHg (07/07 0659) SpO2:  [95 %-100 %] 100 % (07/07 0659) Weight:  [98 kg (216 lb 0.8 oz)] 98 kg (216 lb 0.8 oz) (07/07 0659) Last BM Date: 11/13/15  Intake/Output from previous day: 07/06 0701 - 07/07 0700 In: 3650 [P.O.:400; I.V.:3250] Out: 1220 [Urine:850; Drains:320; Blood:50] Intake/Output this shift:    General appearance: no distress GI: soft approp tender dressings all clean jp with serosang fluid  Lab Results:   Recent Labs  11/14/15 0328  WBC 11.0*  HGB 13.1  HCT 37.5*  PLT 169   BMET  Recent Labs  11/14/15 0328  NA 137  K 4.1  CL 103  CO2 29  GLUCOSE 116*  BUN 19  CREATININE 1.06  CALCIUM 8.8*   PT/INR No results for input(s): LABPROT, INR in the last 72 hours. ABG No results for input(s): PHART, HCO3 in the last 72 hours.  Invalid input(s): PCO2, PO2  Studies/Results: No results found.  Anti-infectives: Anti-infectives    Start     Dose/Rate Route Frequency Ordered Stop   11/13/15 2000  cefoTEtan (CEFOTAN) 2 g in dextrose 5 % 50 mL IVPB     2 g 100 mL/hr over 30 Minutes Intravenous Every 12 hours 11/13/15 1135 11/13/15 2102   11/13/15 0906  clindamycin (CLEOCIN) 900 mg, gentamicin (GARAMYCIN) 240 mg in sodium chloride 0.9 % 1,000 mL for intraperitoneal lavage  Status:  Discontinued       As needed 11/13/15 0906 11/13/15 1127   11/13/15 0533  cefoTEtan (CEFOTAN) 2 g in dextrose 5 % 50 mL IVPB     2 g 100 mL/hr over 30 Minutes Intravenous On call to O.R. 11/13/15 0533 11/13/15 0739   11/12/15 1315  clindamycin (CLEOCIN) 900 mg, gentamicin (GARAMYCIN) 240 mg in sodium chloride 0.9 % 1,000 mL for intraperitoneal lavage  Status:   Discontinued    Comments:  Pharmacy may adjust dosing strength, schedule, rate of infusion, etc as needed to optimize therapy    Intraperitoneal To Surgery 11/12/15 1314 11/13/15 1211      Assessment/Plan: POD 1 robotic lar  1. Continue current pain control 2. Restart hctz today 3. pulm toilet 4. Full liquids, continue entereg until bowel function 5. Lovenox, scds 6. Await pathology  [facility] 11/14/2015

## 2015-11-15 NOTE — Progress Notes (Signed)
Patient ambulated in the hallway frequently.

## 2015-11-15 NOTE — Progress Notes (Signed)
Patient ID: Jon Frost, male   DOB: 1960/11/05, 55 y.o.   MRN: [id number] [facility] Progress Note:   2 Days Post-Op  Subjective: Mental status is clear.  Is more sore today.  Taking clears ok Objective: Vital signs in last 24 hours: Temp:  [98 F (36.7 C)-99.2 F (37.3 C)] 99.2 F (37.3 C) (07/08 0338) Pulse Rate:  [61-69] 65 (07/08 0338) Resp:  [16-20] 20 (07/08 0338) BP: (124-138)/(75-94) 138/94 mmHg (07/08 0338) SpO2:  [95 %-99 %] 99 % (07/08 0338)  Intake/Output from previous day: 07/07 0701 - 07/08 0700 In: 2180 [P.O.:780; I.V.:1400] Out: 3020 [Urine:2850; Drains:170] Intake/Output this shift:    Physical Exam: Work of breathing is incisions OK.  JP with serosanguinous drainage.    Lab Results:  Results for orders placed or performed during the hospital encounter of 11/13/15 (from the past 48 hour(s))  Basic metabolic panel     Status: Abnormal   Collection Time: 11/14/15  3:28 AM  Result Value Ref Range   Sodium 137 135 - 145 mmol/L   Potassium 4.1 3.5 - 5.1 mmol/L   Chloride 103 101 - 111 mmol/L   CO2 29 22 - 32 mmol/L   Glucose, Bld 116 (H) 65 - 99 mg/dL   BUN 19 6 - 20 mg/dL   Creatinine, Ser 1.06 0.61 - 1.24 mg/dL   Calcium 8.8 (L) 8.9 - 10.3 mg/dL   GFR calc non Af Amer >60 >60 mL/min   GFR calc Af Amer >60 >60 mL/min    Comment: (NOTE) The eGFR has been calculated using the CKD EPI equation. This calculation has not been validated in all clinical situations. eGFR's persistently <60 mL/min signify possible Chronic Kidney Disease.    Anion gap 5 5 - 15  CBC     Status: Abnormal   Collection Time: 11/14/15  3:28 AM  Result Value Ref Range   WBC 11.0 (H) 4.0 - 10.5 K/uL   RBC 4.03 (L) 4.22 - 5.81 MIL/uL   Hemoglobin 13.1 13.0 - 17.0 g/dL   HCT 37.5 (L) 39.0 - 52.0 %   MCV 93.1 78.0 - 100.0 fL   MCH 32.5 26.0 - 34.0 pg   MCHC 34.9 30.0 - 36.0 g/dL   RDW 13.1 11.5 - 15.5 %   Platelets 169 150 - 400 K/uL  Magnesium     Status: None   Collection Time: 11/14/15  3:28 AM  Result Value Ref Range   Magnesium 2.1 1.7 - 2.4 mg/dL    Radiology/Results: No results found.  Anti-infectives: Anti-infectives    Start     Dose/Rate Route Frequency Ordered Stop   11/13/15 2000  cefoTEtan (CEFOTAN) 2 g in dextrose 5 % 50 mL IVPB     2 g 100 mL/hr over 30 Minutes Intravenous Every 12 hours 11/13/15 1135 11/13/15 2102   11/13/15 0906  clindamycin (CLEOCIN) 900 mg, gentamicin (GARAMYCIN) 240 mg in sodium chloride 0.9 % 1,000 mL for intraperitoneal lavage  Status:  Discontinued       As needed 11/13/15 0906 11/13/15 1127   11/13/15 0533  cefoTEtan (CEFOTAN) 2 g in dextrose 5 % 50 mL IVPB     2 g 100 mL/hr over 30 Minutes Intravenous On call to O.R. 11/13/15 0533 11/13/15 0739   11/12/15 1315  clindamycin (CLEOCIN) 900 mg, gentamicin (GARAMYCIN) 240 mg in sodium chloride 0.9 % 1,000 mL for intraperitoneal lavage  Status:  Discontinued    Comments:  Pharmacy may adjust dosing strength, schedule, rate  of infusion, etc as needed to optimize therapy    Intraperitoneal To Surgery 11/12/15 1314 11/13/15 1211      Assessment/Plan: Problem List: Patient Active Problem List   Diagnosis Date Noted  . Rectal cancer s/p robotic low anterior rectosigmoid resection 11/13/2015 11/13/2015  . Obesity 11/13/2015  . Hypertension     Doing well post low anterior resection 2 Days Post-Op    LOS: 2 days   [name] Done, MD, [facility] [phone] beeper [phone]  11/15/2015 8:30 AM

## 2015-11-16 MED ORDER — HYDROCODONE-ACETAMINOPHEN 5-325 MG PO TABS
1.0000 | ORAL_TABLET | ORAL | Status: DC | PRN
  Administered 2015-11-16 – 2015-11-17 (×3): 2 via ORAL
  Filled 2015-11-16 (×3): qty 2

## 2015-11-16 MED ORDER — HYDROMORPHONE HCL 1 MG/ML IJ SOLN
1.0000 mg | Freq: Once | INTRAMUSCULAR | Status: AC
  Administered 2015-11-16: 1 mg via INTRAVENOUS
  Filled 2015-11-16: qty 1

## 2015-11-16 NOTE — Progress Notes (Signed)
Nutrition Education Note  RD consulted by RN for nutrition education regarding transitioning to a high fiber diet for patient with history of rectal cancer who is s/p robotic low anterior rectosigmoid resection on 11/13/2015.   Patient in room with wife and visitor at bedside. Pt eating well with lunch tray in front of him. Pt had pork loin and was eating banana pudding during education.  RD provided "High Fiber Nutrition Therapy" handout from the [employer]. Reviewed patient's dietary recall. Provided examples of low and high fiber foods. Discouraged intake of high fiber foods, processed foods, caffeine and red meats right after discharge. Explained how to slowly increase fiber intake over several weeks to a high fiber diet (25-35 g of fiber daily). Encouraged use of a multi-vitamin while following a low fiber diet. Teach back method used.  Expect good compliance. Per patient's wife they follow a healthy diet at home.    Body mass index is 30.28 kg/(m^2). Pt meets criteria for obesity based on current BMI.  Current diet order is regular, patient is consuming approximately 100% of meals at this time. Labs and medications reviewed. No further nutrition interventions warranted at this time.  If additional nutrition issues arise, please re-consult RD.  Clayton Bibles, MS, RD, LDN Pager: [phone] After Hours Pager: [phone]

## 2015-11-16 NOTE — Progress Notes (Signed)
Patient ID: Jon Frost, male   DOB: 1960-08-30, 55 y.o.   MRN: [id number] [facility] Surgery Progress Note:   3 Days Post-Op  Subjective: Mental status is clear.  Feeling better.  Having BMs.  Tolerating liquids Objective: Vital signs in last 24 hours: Temp:  [97.8 F (36.6 C)-99.1 F (37.3 C)] 98.1 F (36.7 C) (07/09 0448) Pulse Rate:  [63-68] 68 (07/09 0448) Resp:  [16-18] 16 (07/09 0448) BP: (129-143)/(84-89) 133/89 mmHg (07/09 0448) SpO2:  [98 %-100 %] 99 % (07/09 0448) Weight:  [98.431 kg (217 lb)] 98.431 kg (217 lb) (07/08 1100)  Intake/Output from previous day: 07/08 0701 - 07/09 0700 In: 2626.6 [P.O.:1500; I.V.:1126.6] Out: 2390 [Urine:2250; Drains:140] Intake/Output this shift:    Physical Exam: Work of breathing is normal.  Incisions OK and covered.  JP serosanguinous  Lab Results:  No results found for this or any previous visit (from the past 48 hour(s)).  Radiology/Results: No results found.  Anti-infectives: Anti-infectives    Start     Dose/Rate Route Frequency Ordered Stop   11/13/15 2000  cefoTEtan (CEFOTAN) 2 g in dextrose 5 % 50 mL IVPB     2 g 100 mL/hr over 30 Minutes Intravenous Every 12 hours 11/13/15 1135 11/13/15 2102   11/13/15 0906  clindamycin (CLEOCIN) 900 mg, gentamicin (GARAMYCIN) 240 mg in sodium chloride 0.9 % 1,000 mL for intraperitoneal lavage  Status:  Discontinued       As needed 11/13/15 0906 11/13/15 1127   11/13/15 0533  cefoTEtan (CEFOTAN) 2 g in dextrose 5 % 50 mL IVPB     2 g 100 mL/hr over 30 Minutes Intravenous On call to O.R. 11/13/15 0533 11/13/15 0739   11/12/15 1315  clindamycin (CLEOCIN) 900 mg, gentamicin (GARAMYCIN) 240 mg in sodium chloride 0.9 % 1,000 mL for intraperitoneal lavage  Status:  Discontinued    Comments:  Pharmacy may adjust dosing strength, schedule, rate of infusion, etc as needed to optimize therapy    Intraperitoneal To Surgery 11/12/15 1314 11/13/15 1211      Assessment/Plan: Problem  List: Patient Active Problem List   Diagnosis Date Noted  . Rectal cancer s/p robotic low anterior rectosigmoid resection 11/13/2015 11/13/2015  . Obesity 11/13/2015  . Hypertension     Will discontinue JP; advance diet to regular and switch to oral pain meds.  Hopeful discharge tomorrow.  3 Days Post-Op    LOS: 3 days   [name] Done, MD, [facility] [phone] beeper [phone]  11/16/2015 9:22 AM

## 2015-11-17 MED ORDER — HYDROCODONE-ACETAMINOPHEN 5-325 MG PO TABS: 1.0000 | ORAL_TABLET | ORAL | Status: DC | PRN

## 2015-11-17 MED ORDER — METHOCARBAMOL 750 MG PO TABS: 750.0000 mg | ORAL_TABLET | Freq: Four times a day (QID) | ORAL | Status: DC | PRN

## 2015-11-17 MED ORDER — METHOCARBAMOL 1000 MG/10ML IJ SOLN: 1000.0000 mg | Freq: Four times a day (QID) | INTRAVENOUS | Status: DC | PRN

## 2015-11-17 NOTE — Progress Notes (Signed)
Pt's vitals are WNL, tolerating diet and pain is under control. Discussed discharge instructions with both patient and family. Discharged home with prescriptions.

## 2015-11-17 NOTE — Progress Notes (Signed)
4 Days Post-Op  Subjective: Having bms, tol diet, no n/v  Objective: Vital signs in last 24 hours: Temp:  [98 F (36.7 C)-98.7 F (37.1 C)] 98 F (36.7 C) (07/10 0433) Pulse Rate:  [64-77] 64 (07/10 0433) Resp:  [18] 18 (07/10 0433) BP: (118-137)/(60-79) 137/75 mmHg (07/10 0433) SpO2:  [96 %-99 %] 98 % (07/10 0433) Last BM Date: 11/17/15  Intake/Output from previous day: 07/09 0701 - 07/10 0700 In: 360 [P.O.:360] Out: 1240 [Urine:1200; Drains:40] Intake/Output this shift:    General appearance: no distress Resp: clear to auscultation bilaterally Cardio: regular rate and rhythm GI: incisions clean bs present soft nt   Anti-infectives: Anti-infectives    Start     Dose/Rate Route Frequency Ordered Stop   11/13/15 2000  cefoTEtan (CEFOTAN) 2 g in dextrose 5 % 50 mL IVPB     2 g 100 mL/hr over 30 Minutes Intravenous Every 12 hours 11/13/15 1135 11/13/15 2102   11/13/15 0906  clindamycin (CLEOCIN) 900 mg, gentamicin (GARAMYCIN) 240 mg in sodium chloride 0.9 % 1,000 mL for intraperitoneal lavage  Status:  Discontinued       As needed 11/13/15 0906 11/13/15 1127   11/13/15 0533  cefoTEtan (CEFOTAN) 2 g in dextrose 5 % 50 mL IVPB     2 g 100 mL/hr over 30 Minutes Intravenous On call to O.R. 11/13/15 0533 11/13/15 0739   11/12/15 1315  clindamycin (CLEOCIN) 900 mg, gentamicin (GARAMYCIN) 240 mg in sodium chloride 0.9 % 1,000 mL for intraperitoneal lavage  Status:  Discontinued    Comments:  Pharmacy may adjust dosing strength, schedule, rate of infusion, etc as needed to optimize therapy    Intraperitoneal To Surgery 11/12/15 1314 11/13/15 1211      Assessment/Plan: S/p lar  Dc home today  [facility] 11/17/2015

## 2015-11-25 ENCOUNTER — Encounter (HOSPITAL_COMMUNITY): Payer: Self-pay

## 2015-11-25 NOTE — Discharge Summary (Signed)
Physician Discharge Summary  Patient ID: Jon Frost MRN: [id number] DOB/AGE: 12-Aug-1960 55 y.o.  Admit date: 11/13/2015 Discharge date: 11/25/2015  Admission Diagnoses: Hypertension Rectal cancer  Discharge Diagnoses:  Principal Problem:   Rectal cancer s/p robotic low anterior rectosigmoid resection 11/13/2015 Active Problems:   Obesity   Hypertension   Discharged Condition: good  Hospital Course: 55 yom who was admitted and underwent robotic LAR. Postoperatively he did well without any complications. His diet was advanced quickly to regular diet. He had return of bowel function and was ambulating well. His incisions remained clean. He will be discharged home with pathology pending.   Consults: None  Significant Diagnostic Studies: none  Treatments: surgery: robot LAR    Disposition: 01-Home or Self Care  Discharge Instructions    Call MD for:  extreme fatigue    Complete by:  As directed      Call MD for:  hives    Complete by:  As directed      Call MD for:  persistant nausea and vomiting    Complete by:  As directed      Call MD for:  redness, tenderness, or signs of infection (pain, swelling, redness, odor or green/yellow discharge around incision site)    Complete by:  As directed      Call MD for:  severe uncontrolled pain    Complete by:  As directed      Call MD for:    Complete by:  As directed   Temperature > 101.2F     Diet - low sodium heart healthy    Complete by:  As directed   Start with bland, low residue diet for a few days, then advance to a heart healthy (low fat, high fiber) diet.  If you feel nauseated or constipated, simplify to a liquid only diet for 48 hours until you are feeling better (no more nausea, farting/passing gas, having a bowel movement, etc...).  If you cannot tolerate even drinking liquids, or feeling worse, let your surgeon know or go to the Emergency Department for help.     Discharge instructions    Complete by:  As directed   Please see discharge instruction sheets.   Also refer to any handouts/printouts that may have been given from the [facility] surgery office (if you visited [place] there before surgery) Please call our office if you have any questions or concerns [phone]     Discharge wound care:    Complete by:  As directed   If you have closed incisions: Shower and bathe over these incisions with soap and water every day.  It is OK to wash over the dressings: they are waterproof. Remove all surgical dressings on postoperative day #3.  You do not need to replace dressings over the closed incisions unless you feel more comfortable with a Band-Aid covering it.   If you have an open wound: That requires packing, so please see wound care instructions.   In general, remove all dressings, wash wound with soap and water and then replace with saline moistened gauze.  Do the dressing change at least every day.    Please call our office [phone] if you have further questions.     Driving Restrictions    Complete by:  As directed   No driving until off narcotics and can safely swerve away without pain during an emergency     Increase activity slowly    Complete by:  As directed  Lifting restrictions    Complete by:  As directed   Avoid heavy lifting initially, <20 pounds at first.   Do not push through pain.   You have no specific weight limit: If it hurts to do, DON'T DO IT.    If you feel no pain, you are not injuring anything.  Pain will protect you from injury.   Coughing and sneezing are far more stressful to your incision than any lifting.   Avoid resuming heavy lifting (>50 pounds) or other intense activity until off all narcotic pain medications.   When want to exercise more, give yourself 2 weeks to gradually get back to full intense exercise/activity.     May shower / Bathe    Complete by:  As directed   [place].  It is fine for dressings or wounds to be washed/rinsed.  Use gentle soap &  water.  This will help the incisions and/or wounds get clean & minimize infection.     May walk up steps    Complete by:  As directed      Sexual Activity Restrictions    Complete by:  As directed   Sexual activity as tolerated.  Do not push through pain.  Pain will protect you from injury.     Walk with assistance    Complete by:  As directed   Walk over an hour a day.  May use a walker/cane/companion to help with balance and stamina.            Medication List    TAKE these medications        FISH OIL PO  Take 1 tablet by mouth daily.     hydrochlorothiazide 25 MG tablet  Commonly known as:  HYDRODIURIL  Take 1 tablet by mouth daily.     HYDROcodone-acetaminophen 5-325 MG tablet  Commonly known as:  NORCO/VICODIN  Take 1-2 tablets by mouth every 4 (four) hours as needed for moderate pain.     methocarbamol 750 MG tablet  Commonly known as:  ROBAXIN  Take 1 tablet (750 mg total) by mouth 4 (four) times daily as needed (use for muscle cramps/pain).     multivitamin tablet  Take 1 tablet by mouth daily.     naproxen 500 MG tablet  Commonly known as:  NAPROSYN  Take 1 tablet (500 mg total) by mouth 2 (two) times daily with a meal.     oxyCODONE 5 MG immediate release tablet  Commonly known as:  Oxy IR/ROXICODONE  Take 1-2 tablets (5-10 mg total) by mouth every 6 (six) hours as needed for moderate pain, severe pain or breakthrough pain.           Follow-up Information    Follow up with GROSS,STEVEN C., MD. Schedule an appointment as soon as possible for a visit in 3 weeks.   Specialty:  General Surgery   Why:  To follow up after your operation, To follow up after your hospital stay   Contact information:   [address] [place] [address] [phone]       Signed: Rolm Bookbinder 11/25/2015, 11:23 AM

## 2015-12-04 ENCOUNTER — Telehealth: Payer: Self-pay | Admitting: *Deleted

## 2015-12-04 NOTE — Telephone Encounter (Addendum)
Oncology Nurse Navigator Documentation  Oncology Nurse Navigator Flowsheets 12/04/2015  Navigator Location [facility]  Navigator Encounter Type Introductory phone call  Called home # and wife answered phone (patient was not available). Informed her that there is not an opening to be seen on 12/08/15. Provided her with 1st available appointments for Dr. Burr Medico and Dr. Benay Spice. She requested Fridays, but then said Friday is not necessary. Suggested they see Dr. Johney Maine on Monday as scheduled and then talk and call back with decision on when to be seen. She agrees to do so. She may call back tomorrow with decision. Provided her my direct #. @ 2:27 pm wife called back to accept the appointment with Dr. Benay Spice for 12/22/15. Informed of location of [place], valet service, and registration process. Reminded to bring insurance cards, photo ID and a current medication list, including supplements.Wife verbalizes understanding. HIM notified to add appointment to [facility].

## 2015-12-22 ENCOUNTER — Encounter: Payer: Self-pay | Admitting: *Deleted

## 2015-12-22 ENCOUNTER — Telehealth: Payer: Self-pay | Admitting: Oncology

## 2015-12-22 ENCOUNTER — Ambulatory Visit (HOSPITAL_BASED_OUTPATIENT_CLINIC_OR_DEPARTMENT_OTHER): Payer: BLUE CROSS/BLUE SHIELD

## 2015-12-22 ENCOUNTER — Ambulatory Visit (HOSPITAL_BASED_OUTPATIENT_CLINIC_OR_DEPARTMENT_OTHER): Payer: BLUE CROSS/BLUE SHIELD | Admitting: Oncology

## 2015-12-22 VITALS — BP 150/91 | HR 76 | Temp 98.7°F | Resp 20 | Ht 71.0 in | Wt 212.8 lb

## 2015-12-22 DIAGNOSIS — C20 Malignant neoplasm of rectum: Secondary | ICD-10-CM

## 2015-12-22 DIAGNOSIS — C187 Malignant neoplasm of sigmoid colon: Secondary | ICD-10-CM

## 2015-12-22 NOTE — Progress Notes (Signed)
[facility] New Patient Consult   Referring MD: Kingsten Enfield Granato 55 y.o.  March 07, 1961    Reason for Referral: Rectal cancer   HPI: He reports noting intermittent rectal bleeding approximately 1 year ago. He had low abdominal discomfort. He was referred to Dr. Watt Climes and underwent a colonoscopy on 10/08/2015. An ulcerated mass was found in the mid sigmoid colon. The mass was circumferential. Biopsies were taken and the area was tattooed. 2 polyps were found in the descending and transverse colon. A small polyp was found in the sigmoid colon. The colonoscopy was accomplished to the proximal transverse colon. The pathology from the sigmoid colon biopsy confirmed adenocarcinoma.  He was referred for staging CTs of the abdomen and pelvis on 10/09/2015. No liver mass. Small bilateral renal calculi. Diverticulosis in the descending and sigmoid colon. Short segment wall thickening in the distal sigmoid colon measuring 5 cm. This is suspicious for a colon carcinoma. No evidence of bowel obstruction. Pathologically enlarged lymph nodes. "Shotty "nodes seen in the gastrohepatic ligament. A CT of the chest on 11/10/2015 showed no evidence of metastatic disease.  He was taken the operating room by Dr. Johney Maine on 11/13/2015 for a robotic assisted low anterior resection. A tumor was noted in the proximal rectum. No evidence of perforation or carcinomatosis. No metastatic disease. The anastomosis is at 9-10 centimeters from the anal verge. The pathology ([id number]) confirmed a well differentiated adenocarcinoma of the proximal rectum. Tumor invaded through the muscular propria into pericolonic tissue. The resection margins are negative. No lymphovascular or perineural invasion. No macroscopic tumor perforation. 15 lymph nodes were negative for metastatic carcinoma. There was an additional tubular adenoma. No loss of mismatch repair protein expression. The tumor returned microsatellite  stable.  He is referred to consider adjuvant treatment options.  Past Medical History:  Diagnosis Date  . History of kidney stones 2013  . Hypertension   .    Marland Kitchen Rectal cancer s/p robotic low anterior rectosigmoid resection 11/13/2015 11/13/2015    Past Surgical History:  Procedure Laterality Date  . NO PAST SURGERIES      Medications: Reviewed  Allergies: No Known Allergies  Family history: His paternal grandfather died of colon cancer at age 66. He has one brother. No children.  Social History:   He lives in [place]. He works in a business occupation. He does not use cigarettes. Rare alcohol use. No transfusion history. No risk factor for HIV or hepatitis.   ROS:   Positives include: Rectal bleeding prior to surgery, low abdominal pain prior to surgery  A complete ROS was otherwise negative.  Physical Exam:  Blood pressure (!) 150/91, pulse 76, temperature 98.7 F (37.1 C), temperature source Oral, resp. rate 20, height '5\' 11"'  (1.803 m), weight 212 lb 12.8 oz (96.5 kg), SpO2 98 %.  HEENT: Oropharynx without Ms. will mass, neck without mass Lungs: Clear bilaterally Cardiac: Regular rate and rhythm Abdomen: No hepatosplenomegaly, no mass, healed surgical incisions GU: Testes without mass  Vascular: No leg edema Lymph nodes: No cervical, supraclavicular, axillary, or inguinal nodes Neurologic: Alert and oriented, the motor exam appears intact in the upper and lower extremities Skin: Keloid formation at anterior chest and upper abdomen Musculoskeletal: No spine tenderness   LAB:  CBC  Lab Results  Component Value Date   WBC 11.0 (H) 11/14/2015   HGB 13.1 11/14/2015   HCT 37.5 (L) 11/14/2015   MCV 93.1 11/14/2015   PLT 169 11/14/2015  CMP      Component Value Date/Time   NA 137 11/14/2015 0328   K 4.1 11/14/2015 0328   CL 103 11/14/2015 0328   CO2 29 11/14/2015 0328   GLUCOSE 116 (H) 11/14/2015 0328   BUN 19 11/14/2015 0328   CREATININE 1.06  11/14/2015 0328   CALCIUM 8.8 (L) 11/14/2015 0328   GFRNONAA >60 11/14/2015 0328   GFRAA >60 11/14/2015 0328   CEA on 10/08/2015-21.1  Imaging: As per history of present illness, CT abdomen/pelvis from 10/09/2015-images were reviewed with Mr. Kohrs and his wife   Assessment/Plan:   1. Rectal cancer, stage II (T3 N0), proximal rectum, status post a low anterior resection 11/13/2015  Microsatellite stable, no loss of mismatch repair protein expression  15 negative lymph nodes, no lymphovascular or perineural invasion  Grade 1  2. History of kidney stones  3.   Family history of colon cancer  4.   Multiple polyps noted on the colonoscopy 10/08/2015   Disposition:   Mr. Beneke has been diagnosed with rectal cancer. I discussed the prognosis and reviewed the details of the surgical pathology report with Mr. Brallier and his wife. It was unclear whether he had a low sigmoid tumor versus proximal rectal tumor on review of the medical record. I discussed the Zalewski with Dr. Johney Maine. He indicates the tumor is in the high rectum. I discussed the [place] with Dr. Lisbeth Renshaw and he agrees the standard of care is to proceed with adjuvant chemotherapy/radiation. He will be referred to radiation oncology.  We will contact Mr. Dimmitt and arrange for a follow-up visit in medical oncology the same day he is seen for radiation oncology consultation.  We discussed diet and exercise maneuvers that may decrease the risk of developing colon cancer. He understands family members should receive appropriate screening for colorectal cancer.  He was noted to have multiple polyps on the colonoscopy in May. He will be scheduled undergo a completion colonoscopy within the next few months.  Approximately 50 minutes were spent with the patient today. The majority of the time was used for counseling and coordination of care.  Betsy Coder, MD  12/22/2015, 3:23 PM

## 2015-12-22 NOTE — Progress Notes (Signed)
Oncology Nurse Navigator Documentation  Oncology Nurse Navigator Flowsheets 12/22/2015  Navigator Location [facility]  Navigator Encounter Type Initial MedOnc  Abnormal Finding Date 10/08/2015  Confirmed Diagnosis Date 10/08/2015  Surgery Date 11/13/2015  Treatment Initiated Date (No Data)--no tx recommended-  Patient Visit Type MedOnc;Initial  Lawyer Newly Diagnosed Cancer Education;Other--surveiliance plan   Interventions Education Method  Education Method Verbal;Written  Support Groups/Services GI Support Group;Other--[place] Support Services  Acuity Level 1  Time Spent with Patient 21  Met with patient and wife, Jan (who is breast cancer survivor)  during new patient visit. Explained the role of the GI Nurse Navigator and provided New Patient Packet with information on: 1. Colorectal cancer--info on CEA level and how to start exercise program  2. Support groups 3. Advanced Directives 4. Fall Safety Plan Answered questions, reviewed current treatment plan using TEACH back and provided emotional support. Provided copy of current treatment plan, which is surveillance.   Merceda Elks, RN, BSN GI Oncology [place]

## 2015-12-22 NOTE — Telephone Encounter (Signed)
GAVE PATIENT AVS REPORT AND APPOINTMENTS FOR February. °

## 2015-12-22 NOTE — Patient Instructions (Signed)
Care Plan Summary- 12/22/2015 Name:  Jon Frost     DOB: 08/05/1960 Your Medical Team:  Medical Oncologist:  Dr. Ma Rings Radiation Oncologist:    Surgeon:   Dr. Michael Boston Type of Cancer: Colorectal Cancer-adenocarcinoma  Stage/Grade: Stage II (pT3 pN0)  *Exact staging of your cancer is based on size of the tumor, depth of invasion, involvement of lymph nodes or not, and whether or not the cancer has spread beyond the primary site.   Recommendations: Based on information available as of today's consult. Recommendations may change depending on the results of further tests or exams. 1) CEA blood test and MD exam every 6 months 2) Repeat colonoscopy per Dr. Watt Climes in November due to polyps 3) All 1st degree relatives and children of these should have colonoscopy by age 68 ___________________________________________________________________________ Next Steps: 1) Lab today and schedule 6 month return visit 2) Exercise and low fat diet to reduce risk for recurrence 3)  Questions? Jon Elks, RN, BSN at [phone]. Jon Frost is your Oncology Nurse Navigator and is available to assist you while you're receiving your medical care at [facility]. *Your cancer has 80% or greater chance of cure with surgery alone. *You had no high risk features and your Lynch syndrome testing was negative, but your family still is at higher risk for colon cancer

## 2015-12-23 ENCOUNTER — Other Ambulatory Visit: Payer: Self-pay | Admitting: *Deleted

## 2015-12-23 DIAGNOSIS — C20 Malignant neoplasm of rectum: Secondary | ICD-10-CM

## 2015-12-23 LAB — CEA (IN HOUSE-CHCC): CEA (CHCC-IN HOUSE): 1 ng/mL (ref 0.00–5.00)

## 2015-12-25 ENCOUNTER — Telehealth: Payer: Self-pay | Admitting: *Deleted

## 2015-12-25 NOTE — Telephone Encounter (Signed)
Confirmed with Mr. Koerner that he agrees to seeing Dr. Lisbeth Renshaw in clinic on 8/18 at 10:30 and will see Dr. Benay Spice later at 12:30 pm.

## 2015-12-26 ENCOUNTER — Ambulatory Visit (HOSPITAL_BASED_OUTPATIENT_CLINIC_OR_DEPARTMENT_OTHER): Payer: Self-pay | Admitting: Oncology

## 2015-12-26 ENCOUNTER — Ambulatory Visit
Admission: RE | Admit: 2015-12-26 | Discharge: 2015-12-26 | Disposition: A | Discharge status: Home / Self Care | Payer: BLUE CROSS/BLUE SHIELD | Source: Ambulatory Visit | Attending: Radiation Oncology | Admitting: Radiation Oncology

## 2015-12-26 ENCOUNTER — Ambulatory Visit: Payer: BLUE CROSS/BLUE SHIELD | Admitting: Nutrition

## 2015-12-26 VITALS — BP 152/99 | HR 72 | Temp 98.2°F | Resp 17 | Ht 71.0 in | Wt 211.9 lb

## 2015-12-26 DIAGNOSIS — C20 Malignant neoplasm of rectum: Secondary | ICD-10-CM

## 2015-12-26 NOTE — Progress Notes (Signed)
Radiation Oncology         [phone] ________________________________  Name: Jon Frost MRN: [id number]  Date: 12/26/2015  DOB: 1960/08/02  [id number], Jon Saxon, MD  Jon Pier, MD     REFERRING PHYSICIAN: Ladell Pier, MD   DIAGNOSIS: The encounter diagnosis was Rectal cancer s/p robotic low anterior rectosigmoid resection 11/13/2015.   HISTORY OF PRESENT ILLNESS: Jon Frost is a 55 y.o. male seen at the request of Dr. Benay Frost for a newly diagnosed adenocarcinoma of the rectum.  The patient was initially diagnosed after he was evaluated for abdominal discomfort. A colonoscopy revealed an ulcerated mass in the mid sigmoid colon which was consistent with malignancy. Biopsy revealed adenocarcinoma.  Staging CT scans of the abdomen and pelvis revealed wall thickening in the distal colon measuring 5 cm. There was not any evidence of metastatic disease. No evidence of bowel obstruction. The patient's imaging did include a CT scan of the chest as well as of the abdomen/pelvis.  On 11/13/2015. The patient underwent a robotic assisted low anterior resection. The tumor was noted to be in the very proximal rectum. No evidence of perforation or carcinomatosis. The anastomosis was located at approximately 9-10 cm from the anal verge. The pathology confirmed a well differentiated adenocarcinoma. This represented a T3 N0 tumor with non-of 15 lymph nodes positive for carcinoma.  The patient has been seen by medical oncology and I have been asked to see the patient for consideration of possible postoperative radiation treatment.    PREVIOUS RADIATION THERAPY: No   PAST MEDICAL HISTORY:  Past Medical History:  Diagnosis Date  . History of kidney stones 2013  . Hypertension   . Obesity 11/13/2015  . Rectal cancer s/p robotic low anterior rectosigmoid resection 11/13/2015 11/13/2015       PAST SURGICAL HISTORY: Past Surgical History:  Procedure Laterality Date  . NO PAST SURGERIES        FAMILY HISTORY: No family history on file.   SOCIAL HISTORY:  reports that he quit smoking about 27 years ago. His smoking use included Cigarettes. He has a 7.50 pack-year smoking history. He has never used smokeless tobacco. He reports that he drinks alcohol. He reports that he does not use drugs.   ALLERGIES: Review of patient's allergies indicates no known allergies.   MEDICATIONS:  Current Outpatient Prescriptions  Medication Sig Dispense Refill  . hydrochlorothiazide (HYDRODIURIL) 25 MG tablet Take 1 tablet by mouth daily.    Marland Kitchen HYDROcodone-acetaminophen (NORCO/VICODIN) 5-325 MG tablet Take 1-2 tablets by mouth every 4 (four) hours as needed for moderate pain. 30 tablet 0  . methocarbamol (ROBAXIN) 750 MG tablet Take 1 tablet (750 mg total) by mouth 4 (four) times daily as needed (use for muscle cramps/pain). 30 tablet 0  . Multiple Vitamin (MULTIVITAMIN) tablet Take 1 tablet by mouth daily.    . naproxen (NAPROSYN) 500 MG tablet Take 1 tablet (500 mg total) by mouth 2 (two) times daily with a meal. 40 tablet 1  . Omega-3 Fatty Acids (FISH OIL PO) Take 1 tablet by mouth daily.    Marland Kitchen oxyCODONE (OXY IR/ROXICODONE) 5 MG immediate release tablet Take 1-2 tablets (5-10 mg total) by mouth every 6 (six) hours as needed for moderate pain, severe pain or breakthrough pain. 30 tablet 0   No current facility-administered medications for this encounter.      REVIEW OF SYSTEMS: Fully reviewed, negative other than as noted above. The patient is recovering well from his surgery.  PHYSICAL EXAM:  Alert, no acute distress  ECOG = 1  0 - Asymptomatic (Fully active, able to carry on all predisease activities without restriction)  1 - Symptomatic but completely ambulatory (Restricted in physically strenuous activity but ambulatory and able to carry out work of a light or sedentary nature. For example, light housework, office work)  2 - Symptomatic, <50% in bed during the day  (Ambulatory and capable of all self care but unable to carry out any work activities. Up and about more than 50% of waking hours)  3 - Symptomatic, >50% in bed, but not bedbound (Capable of only limited self-care, confined to bed or chair 50% or more of waking hours)  4 - Bedbound (Completely disabled. Cannot carry on any self-care. Totally confined to bed or chair)  5 - Death   Jon Frost MM, Jon Frost, Jon Frost, et al. [phone]). "Toxicity and response criteria of the [facility]". Jon Frost. 5 (6): 649-55    LABORATORY DATA:  Lab Results  Component Value Date   WBC 11.0 (H) 11/14/2015   HGB 13.1 11/14/2015   HCT 37.5 (L) 11/14/2015   MCV 93.1 11/14/2015   PLT 169 11/14/2015   Lab Results  Component Value Date   NA 137 11/14/2015   K 4.1 11/14/2015   CL 103 11/14/2015   CO2 29 11/14/2015   No results found for: ALT, AST, GGT, ALKPHOS, BILITOT    RADIOGRAPHY: No results found.     IMPRESSION/PLAN: 1.T3 N0 M0 proximal rectal cancer status post resection   The patient is doing well postoperatively. We reviewed his Hoggard including his pathology results in detail. We discussed in NCCN guidelines. Given the specifics of his Sissel, I believe that the standard recommendation would be to proceed with postoperative chemoradiation treatment even in light of a very good pathology report. I discussed the potential benefit of treatment as well as possible side effects and risks.  At the end of this discussion, the patient indicated that he wished to forego radiation treatment. He felt comfortable with where he stands currently and is pleased with his pathology report. He understands the issues involved. We therefore have not scheduled him for any future appointments but I would be happy to see him at any point if necessary.

## 2015-12-26 NOTE — Progress Notes (Signed)
Patient declined nutrition services.

## 2015-12-26 NOTE — Progress Notes (Signed)
  [place] OFFICE PROGRESS NOTE   Diagnosis: Rectal cancer  INTERVAL HISTORY:   Mr. Buer returns for further discussion regarding adjuvant therapy. I discussed the rectal cancer diagnosis and recommendation for adjuvant chemotherapy/radiation with him by telephone earlier this week.  Objective:  Vital signs in last 24 hours:  Blood pressure (!) 152/99, pulse 72, temperature 98.2 F (36.8 C), temperature source Oral, resp. rate 17, height '5\' 11"'$  (1.803 m), weight 211 lb 14.4 oz (96.1 kg), SpO2 100 %.    Physical examination-not performed today  Lab Results:  12/22/2015-CEA   1.0     Medications: I have reviewed the patient's current medications.  Assessment/Plan: 1. Rectal cancer, stage II (T3 N0), proximal rectum, status post a low anterior resection 11/13/2015 ? Microsatellite stable, no loss of mismatch repair protein expression ? 15 negative lymph nodes, no lymphovascular or perineural invasion ? Grade 1  2. History of kidney stones  3.   Family history of colon cancer  4.   Multiple polyps noted on the colonoscopy 10/08/2015    Disposition:  Mr. Boisselle has been diagnosed with a stage II carcinoma of the proximal rectum. I recommend adjuvant Xeloda/radiation. He saw Dr. Lisbeth Renshaw earlier today. Mr. Suh declines adjuvant therapy. We also discussed the potential benefit associated with adjuvant single modality capecitabine. We reviewed the potential toxicities associated with capecitabine.  He declines adjuvant therapy.  Mr. Huston Foley will return for an office visit and CEA in 6 months. He will see Dr.  Watt Climes for a completion colonoscopy in the interim.  Betsy Coder, MD  12/26/2015  1:10 PM

## 2016-06-18 ENCOUNTER — Telehealth: Payer: Self-pay | Admitting: *Deleted

## 2016-06-18 ENCOUNTER — Ambulatory Visit (HOSPITAL_BASED_OUTPATIENT_CLINIC_OR_DEPARTMENT_OTHER): Payer: Self-pay | Admitting: Oncology

## 2016-06-18 ENCOUNTER — Other Ambulatory Visit (HOSPITAL_BASED_OUTPATIENT_CLINIC_OR_DEPARTMENT_OTHER): Payer: BLUE CROSS/BLUE SHIELD

## 2016-06-18 VITALS — BP 133/86 | HR 67 | Temp 97.7°F | Resp 18 | Wt 227.4 lb

## 2016-06-18 DIAGNOSIS — C20 Malignant neoplasm of rectum: Secondary | ICD-10-CM

## 2016-06-18 DIAGNOSIS — C187 Malignant neoplasm of sigmoid colon: Secondary | ICD-10-CM

## 2016-06-18 LAB — CEA (IN HOUSE-CHCC): CEA (CHCC-In House): 1.16 ng/mL (ref 0.00–5.00)

## 2016-06-18 NOTE — Progress Notes (Signed)
He checked in and left the office prior to being seen today.  Betsy Coder, MD  06/18/2016  11:06 AM

## 2016-06-18 NOTE — Telephone Encounter (Signed)
Left message for pt to call [place] back for lab results.

## 2016-06-18 NOTE — Progress Notes (Signed)
Patient left without being seen today and without letting staff know he was leaving.  Call placed to patient's mobile # to check on patient's status and he stated that he had to leave to pick his wife up at another  appointment and did not want to reschedule his appointment with Dr. Benay Spice and did not feel the need to be seen by him any further.  Patient instructed to call [facility] if he would like to see Dr. Benay Spice in the future.  Patient verbalized an understanding to call [facility] if appt needed.  Instructed pt that I would call him when cea results from today are available.

## 2016-06-21 NOTE — Telephone Encounter (Signed)
FYI Return call received from patient requesting results.  Informed him the CEA = 1.16 which falls WNL of 0.00  To 5.00 range.  No further questions.

## 2016-09-14 ENCOUNTER — Telehealth: Payer: Self-pay | Admitting: *Deleted

## 2016-09-14 NOTE — Telephone Encounter (Signed)
Message from pt's wife requesting follow up appointment. Returned call, wife reports pt had colonoscopy 04/2016, had recent visit with PCP who recommended he follow up with Dr. Benay Spice. Wife denies any new symptoms.  Discussed with Dr. Benay Spice, appt given for 5/15 @ 10AM. Wife voiced appreciation, she will have colonoscopy sent to office.

## 2016-09-21 ENCOUNTER — Ambulatory Visit (HOSPITAL_BASED_OUTPATIENT_CLINIC_OR_DEPARTMENT_OTHER): Payer: BLUE CROSS/BLUE SHIELD

## 2016-09-21 ENCOUNTER — Ambulatory Visit (HOSPITAL_BASED_OUTPATIENT_CLINIC_OR_DEPARTMENT_OTHER): Payer: BLUE CROSS/BLUE SHIELD | Admitting: Oncology

## 2016-09-21 ENCOUNTER — Telehealth: Payer: Self-pay | Admitting: Oncology

## 2016-09-21 VITALS — BP 145/84 | HR 60 | Temp 98.1°F | Resp 18 | Ht 71.0 in | Wt 229.0 lb

## 2016-09-21 DIAGNOSIS — C20 Malignant neoplasm of rectum: Secondary | ICD-10-CM

## 2016-09-21 DIAGNOSIS — Z85048 Personal history of other malignant neoplasm of rectum, rectosigmoid junction, and anus: Secondary | ICD-10-CM

## 2016-09-21 LAB — CEA (IN HOUSE-CHCC): CEA (CHCC-In House): 1.47 ng/mL (ref 0.00–5.00)

## 2016-09-21 NOTE — Progress Notes (Signed)
  [facility] OFF[facility] PROGRESS NOTE   Diagnosis: Rectal cancer  INTERVAL HISTORY:   [name] returns after missing a scheduled appointment in February. He reports feeling well. Good appetite. No difficulty with bowel function. He has experienced 2 episodes of right lower abdominal pain lasting for hours. No consistent pain. He currently has a flare of "gout "in the right great toe. He was prescribed colchicine by Dr. Brigitte Frost.  Hand when he colonoscopy by Dr. Watt Frost on 04/09/2016. 3 and a sessile polyps were found in the descending and transverse colon. The polyps were removed. A tiny polyp was noted at the sigmoid anastomosis. This was biopsied. The pathology revealed tubular adenomas and a sessile ulcerated polyp. The biopsy from the anastomosis returned as a polypoid colonic mucosa.  Objective:  Vital signs in last 24 hours:  Blood pressure (!) 145/84, Frost 60, temperature 98.1 F (36.7 C), temperature source Oral, resp. rate 18, height _0  (1.803 m), weight 229 lb (103.9 kg), SpO2 99 %.    HEENT: Neck without mass Lymphatics: No cervical, supraclavicular, axillary, or inguinal nodes Resp: Lungs clear bilaterally Cardio: Regular rate and rhythm GI: No hepatosplenomegaly, no mass, nontender Vascular: No leg edema Musculoskeletal: Erythema and swelling at the right first MTP joint    Lab Results:    Lab Results  Component Value Date   CEA1 1.16 06/18/2016    No results found for: INR  Imaging:  No results found.  Medications: I have reviewed the patient's current medications.  Assessment/Plan: 1. Rectal cancer, stage II (T3 N0), proximal rectum, status post a low anterior resection 11/13/2015 ? Microsatellite stable, no loss of mismatch repair protein expression ? 15 negative lymph nodes, no lymphovascular or perineural invasion ? Grade 1 ? Colonoscopy 04/09/2016-polyp was removed from the descending and transverse colon-2 per adenomas and sessile  ulcerated polyp, polyp at the sigmoid anastomosis-polypoid colonic mucosa  2. History of kidney stones  3. Family history of colon cancer  4. Multiple polyps noted on the colonoscopy 10/08/2015  5.   Gout  Disposition:  [name] is in clinical remission from rectal cancer. We will follow-up on the CEA from today. He will contact Dr. Brigitte Frost if the gout flare does not resolve over the next few days. He will contact us for consistent abdominal pain.  [name] will return for an office visit and CEA in 6 months.  15 minutes were spent with the patient today. The majority of the time was used for counseling and coordination of care.  Jon Coder, MD  09/21/2016  10:35 AM

## 2016-09-21 NOTE — Telephone Encounter (Signed)
Scheduled appt per 5/15 los. Gave patient AVS and calender per 5/15 los.  

## 2016-09-27 ENCOUNTER — Telehealth: Payer: Self-pay | Admitting: *Deleted

## 2016-09-27 NOTE — Telephone Encounter (Signed)
-----   Message from Ladell Pier, MD sent at 09/27/2016  2:46 PM EDT ----- Please call patient, cea is normal

## 2016-09-27 NOTE — Telephone Encounter (Signed)
Telephone call to patient- advised lab results per below. Pt expressed much gratitude for rescheduling his missed appointment last week. Verbalized an understanding to call the office with any concerns or questions.

## 2017-03-24 ENCOUNTER — Other Ambulatory Visit: Payer: BLUE CROSS/BLUE SHIELD

## 2017-03-24 ENCOUNTER — Ambulatory Visit: Payer: BLUE CROSS/BLUE SHIELD | Admitting: Oncology

## 2017-04-04 ENCOUNTER — Ambulatory Visit (HOSPITAL_BASED_OUTPATIENT_CLINIC_OR_DEPARTMENT_OTHER): Payer: BLUE CROSS/BLUE SHIELD | Admitting: Oncology

## 2017-04-04 ENCOUNTER — Encounter: Payer: Self-pay | Admitting: Oncology

## 2017-04-04 ENCOUNTER — Telehealth: Payer: Self-pay | Admitting: Oncology

## 2017-04-04 ENCOUNTER — Other Ambulatory Visit (HOSPITAL_BASED_OUTPATIENT_CLINIC_OR_DEPARTMENT_OTHER): Payer: BLUE CROSS/BLUE SHIELD

## 2017-04-04 VITALS — BP 137/84 | HR 67 | Temp 98.0°F | Resp 16 | Ht 71.0 in | Wt 223.6 lb

## 2017-04-04 DIAGNOSIS — C20 Malignant neoplasm of rectum: Secondary | ICD-10-CM

## 2017-04-04 DIAGNOSIS — Z85048 Personal history of other malignant neoplasm of rectum, rectosigmoid junction, and anus: Secondary | ICD-10-CM

## 2017-04-04 LAB — CEA (IN HOUSE-CHCC): CEA (CHCC-In House): 1.62 ng/mL (ref 0.00–5.00)

## 2017-04-04 NOTE — Telephone Encounter (Signed)
Gave avs and calendar for May 2019 °

## 2017-04-04 NOTE — Progress Notes (Signed)
  [place] OFFICE PROGRESS NOTE   Diagnosis: Rectal cancer  INTERVAL HISTORY:   Jon Frost returns as scheduled.  He feels well.  No recent gout flare.  He has irregular bowel habits, but no difficulty with bowel control.  Good appetite.  Objective:  Vital signs in last 24 hours:  Blood pressure 137/84, pulse 67, temperature 98 F (36.7 C), temperature source Oral, resp. rate 16, height '5\' 11"'$  (1.803 m), weight 223 lb 9.6 oz (101.4 kg), SpO2 99 %.    HEENT: Neck without mass Lymphatics: No cervical, supraclavicular, axillary, or inguinal nodes.  Prominent bilateral axillary fat pads Resp: Lungs clear bilaterally Cardio: Regular rate and rhythm GI: No hepatosplenomegaly, no mass, nontender Vascular: No leg edema   Lab Results:  Lab Results  Component Value Date   CEA1 1.47 09/21/2016     Medications: I have reviewed the patient's current medications.  Assessment/Plan: 1. Rectal cancer, stage II (T3 N0), proximal rectum, status post a low anterior resection 11/13/2015 ? Microsatellite stable, no loss of mismatch repair protein expression ? 15 negative lymph nodes, no lymphovascular or perineural invasion ? Grade 1 ? Colonoscopy 04/09/2016-polyp was removed from the descending and transverse colon-2 per adenomas and sessile ulcerated polyp, polyp at the sigmoid anastomosis-polypoid colonic mucosa  2. History of kidney stones  3. Family history of colon cancer  4. Multiple polyps noted on the colonoscopy 10/08/2015  5.   Gout   Disposition:  Jon Frost remains in clinical remission from rectal cancer.  We will follow-up on the CEA from today.  He will return for an office visit and CEA in 6 months.  15 minutes were spent with the patient today.  The majority of the time was used for counseling and coordination of care.  Betsy Coder, MD  04/04/2017  4:17 PM

## 2017-04-07 ENCOUNTER — Encounter: Payer: Self-pay | Admitting: Oncology

## 2017-04-09 NOTE — Telephone Encounter (Signed)
Please let him know the slight increase  In the CEA over the past year is likely not significant.  CEA is normal. F/u as scheduled,repeat in 6 months

## 2017-09-20 ENCOUNTER — Other Ambulatory Visit: Payer: Self-pay | Admitting: Internal Medicine

## 2017-09-20 DIAGNOSIS — E781 Pure hyperglyceridemia: Secondary | ICD-10-CM

## 2017-09-20 DIAGNOSIS — E8881 Metabolic syndrome: Secondary | ICD-10-CM

## 2017-10-07 ENCOUNTER — Telehealth: Payer: Self-pay

## 2017-10-07 ENCOUNTER — Inpatient Hospital Stay: Payer: BLUE CROSS/BLUE SHIELD

## 2017-10-07 ENCOUNTER — Inpatient Hospital Stay: Payer: BLUE CROSS/BLUE SHIELD | Attending: Oncology | Admitting: Oncology

## 2017-10-07 ENCOUNTER — Encounter: Payer: Self-pay | Admitting: Oncology

## 2017-10-07 ENCOUNTER — Ambulatory Visit
Admission: RE | Admit: 2017-10-07 | Discharge: 2017-10-07 | Disposition: A | Discharge status: Home / Self Care | Payer: No Typology Code available for payment source | Source: Ambulatory Visit | Attending: Internal Medicine | Admitting: Internal Medicine

## 2017-10-07 VITALS — BP 101/70 | HR 60 | Temp 98.5°F | Resp 17 | Ht 71.0 in | Wt 230.6 lb

## 2017-10-07 DIAGNOSIS — Z85048 Personal history of other malignant neoplasm of rectum, rectosigmoid junction, and anus: Secondary | ICD-10-CM | POA: Diagnosis not present

## 2017-10-07 DIAGNOSIS — C20 Malignant neoplasm of rectum: Secondary | ICD-10-CM

## 2017-10-07 DIAGNOSIS — E8881 Metabolic syndrome: Secondary | ICD-10-CM

## 2017-10-07 DIAGNOSIS — E781 Pure hyperglyceridemia: Secondary | ICD-10-CM

## 2017-10-07 LAB — CEA (IN HOUSE-CHCC): CEA (CHCC-In House): 1.47 ng/mL (ref 0.00–5.00)

## 2017-10-07 NOTE — Progress Notes (Signed)
  [place] OFFICE PROGRESS NOTE   Diagnosis: Rectal cancer  INTERVAL HISTORY:   Jon Frost returns as scheduled.  He feels well.  He has rectal urgency after eating.  No bleeding.  No other complaint.  Objective:  Vital signs in last 24 hours:  Blood pressure 101/70, pulse 60, temperature 98.5 F (36.9 C), temperature source Oral, resp. rate 17, height 5' 11" (1.803 m), weight 230 lb 9.6 oz (104.6 kg), SpO2 98 %.    HEENT: Neck without mass Lymphatics: No cervical, supraclavicular, axillary, or inguinal nodes Resp: Lungs clear bilaterally Cardio: Regular rate and rhythm GI: No hepatosplenomegaly, no mass, slight soft fullness in the medial left inguinal canal without a discrete mass or hernia Vascular: No leg edema   Lab Results:   Lab Results  Component Value Date   CEA1 1.47 10/07/2017     Medications: I have reviewed the patient's current medications.   Assessment/Plan: 1. Rectal cancer, stage II (T3 N0), proximal rectum, status post a low anterior resection 11/13/2015 ? Microsatellite stable, no loss of mismatch repair protein expression ? 15 negative lymph nodes, no lymphovascular or perineural invasion ? Grade 1 ? Colonoscopy 04/09/2016-polyp was removed from the descending and transverse colon- tubular adenomas and sessile serrated polyp, polyp at the sigmoid anastomosis-polypoid colonic mucosa  2. History of kidney stones  3. Family history of colon cancer  4. Multiple polyps noted on the colonoscopy 10/08/2015  5.Gout   Disposition: Jon Frost remains in clinical remission from rectal cancer.  He will return for an office visit and CEA in 1 year.  15 minutes were spent with the patient today.  The majority of the time was used for counseling and coordination of care.  Betsy Coder, MD  10/07/2017  1:50 PM

## 2017-10-07 NOTE — Telephone Encounter (Signed)
Printed avs and calender of upcoming appointment. Per 5/31 los 

## 2017-10-26 NOTE — Progress Notes (Signed)
Cardiology Office Note:    Date:  10/27/2017   ID:  Jon Frost, DOB 1960-11-18, MRN [id number]  PCP:  Marton Redwood, MD  Cardiologist:   Nelva Bush, MD  -New  Referring MD: Marton Redwood, MD   Chief Complaint  Patient presents with  . Hyperlipidemia    Elevated calcium score    History of Present Illness:    Jon Frost is a 57 y.o. male who is being seen today for the evaluation of cardiac risk at the request of Marton Redwood, MD.   The patient has a past medical history significant for rectal cancer s/p robotic rectosigmoid resection 11/2015 in remission, obesity, hypertension, gout, CKD, metablolic syndrome. He was noted to have elevated triglycerides of 242, LDL 80, Low HDL of 26 and so his PCP ordered coronary calcium scoring which showed calcium score of 685 which is at the percentile 97 for subjects of the same age, gender and race/ethnicity who are free of clinical cardiovascular disease and treated diabetes.  His father had CABG in his early 92's. No other known family cardiac history. He smoked in his teens to early 20's, none since. He drinks a glass of wine a few times per year.   A1c was normal at 5.0.  Lipid panel 08/2017- LDL 80, HDL 26, Trig 242. Home BPs have been 125-130/70-75.  Body mass index is 32.26 kg/m.   He is here with his wife. 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 No formal exercise. He works out in the yard, cutting grass, Holiday representative, working in the garage on cars. He has no exertional chest pain/pressue/tightness or shortness of breath. He does admit that he is not able to do as much as he used to do prior to his colon cancer. He occ gets lightheaded when rising up from squatted position. No orthopnea, PND or edema. He tries to follow a heart healthy diet with the healthy fats and lots of fresh vegetables, no fast food and limited processed foods. His wife works hard on preparing  healthy foods. He does nto like to take medicine but will if he really needs to.   No flowsheet data found.   Past Medical History:  Diagnosis Date  . CKD (chronic kidney disease), stage III ([place])   . Gout   . History of kidney stones 2013  . Hypertension   . Hypertriglyceridemia   . Impaired fasting glucose   . Metabolic syndrome   . Obesity 11/13/2015  . Rectal cancer s/p robotic low anterior rectosigmoid resection 11/13/2015 11/13/2015  . Recurrent nephrolithiasis   . Snoring     Past Surgical History:  Procedure Laterality Date  . COLON SURGERY     LAR    Current Medications: Current Meds  Medication Sig  . colchicine 0.6 MG tablet Take 0.6 mg by mouth every 8 (eight) hours as needed (Gout).   . Omega-3 Fatty Acids (FISH OIL PO) Take 1 tablet by mouth daily.  . rosuvastatin (CRESTOR) 20 MG tablet Take 20 mg by mouth at bedtime.  . valsartan-hydrochlorothiazide (DIOVAN-HCT) 160-25 MG tablet Take 0.5 tablets by mouth daily.     Allergies:   Patient has no known allergies.   Social History   Socioeconomic History  . Marital status: Married    Spouse name: [name]  . Number of children: 0  . Years of education: Not on file  . Highest education level: Not  on file  Occupational History  . Not on file  Social Needs  . Financial resource strain: Not on file  . Food insecurity:    Worry: Not on file    Inability: Not on file  . Transportation needs:    Medical: Not on file    Non-medical: Not on file  Tobacco Use  . Smoking status: Former Smoker    Packs/day: 0.50    Years: 15.00    Pack years: 7.50    Types: Cigarettes    Last attempt to quit: 11/09/1988    Years since quitting: 28.9  . Smokeless tobacco: Never Used  Substance and Sexual Activity  . Alcohol use: Yes    Alcohol/week: 0.0 oz    Comment: glass wine occasionally  . Drug use: No  . Sexual activity: Not on file    Comment: JAN  Lifestyle  . Physical activity:    Days per week: Not on file     Minutes per session: Not on file  . Stress: Not on file  Relationships  . Social connections:    Talks on phone: Not on file    Gets together: Not on file    Attends religious service: Not on file    Active member of club or organization: Not on file    Attends meetings of clubs or organizations: Not on file    Relationship status: Not on file  Other Topics Concern  . 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 ROS:   Please see the history of present illness.     All other systems reviewed and are negative.  EKGs/Labs/Other Studies Reviewed:    The following studies were reviewed today:  CORONARY CALCIUM by CT 10/07/17  Total Agatston Score: 685 with calcifications throughout the left anterior descending and right coronary arteries. Calcifications also noted in the left circumflex to a lesser extent.  MESA database percentile:  43  OTHER FINDINGS:  Cardiovascular: Heart is upper limits normal in size. Ascending aorta slightly dilated at 4.1 cm maximally.  Mediastinum/Nodes: No adenopathy in the lower mediastinum or hila.  Lungs/Pleura: No confluent airspace opacities or effusions.  Upper Abdomen: Imaging into the upper abdomen shows no acute findings.  Musculoskeletal: Chest wall soft tissues are unremarkable. No acute bony abnormality.  IMPRESSION: The observed calcium score of 685 is at the percentile 97 for subjects of the same age, gender and race/ethnicity who are free of clinical cardiovascular disease and treated diabetes.  4.1 cm ascending thoracic aortic aneurysm.  Recommend annual imaging followup by CTA or MRA. This recommendation follows 2010 ACCF/AHA/AATS/ACR/ASA/SCA/SCAI/SIR/STS/SVM Guidelines for the Diagnosis and Management of Patients with Thoracic Aortic Disease. Circulation. 2010; 121: T035-W656   EKG:  EKG is ordered today.  The ekg ordered today demonstrates NSR 63 bpm. Normal   Recent Labs: No results found for requested labs within last 8760 hours.   Recent Lipid Panel No results found for: CHOL, TRIG, HDL, CHOLHDL, VLDL, LDLCALC, LDLDIRECT  Physical Exam:    VS:  BP 126/74   Pulse 63   Ht 5\' 10"  (1.778 m)   Wt 224  lb 12.8 oz (102 kg)   BMI 32.26 kg/m     Wt Readings from Last 3 Encounters:  10/27/17 224 lb 12.8 oz (102 kg)  10/07/17 230 lb 9.6 oz (104.6 kg)  04/04/17 223 lb 9.6 oz (101.4 kg)     GEN:  Well nourished, well developed in no acute distress HEENT: Normal NECK: No JVD; No carotid bruits LYMPHATICS: No lymphadenopathy CARDIAC: RRR, no murmurs, rubs, gallops RESPIRATORY:  Clear to auscultation without rales, wheezing or rhonchi  ABDOMEN: Soft, non-tender, non-distended MUSCULOSKELETAL:  No edema; No deformity  SKIN: Warm and dry NEUROLOGIC:  Alert and oriented x 3 PSYCHIATRIC:  Normal affect   ASSESSMENT:    1. Agatston coronary artery calcium score greater than 400   2. Metabolic syndrome   3. Thoracic aortic aneurysm without rupture ([place])   4. Essential (primary) hypertension   5. Class 1 obesity due to excess calories with body mass index (BMI) of 32.0 to 32.9 in adult, unspecified whether serious comorbidity present   6. CKD (chronic kidney disease) stage 3, GFR 30-59 ml/min ([facility])   7. Dyslipidemia, goal LDL below 70   8. Snoring    PLAN:    Pt was seen and examined by myself and Dr End in clinic today. This patient's Dettmer was discussed in depth with Dr. Saunders Revel. The plan below was formulated per our discussion.  In order of problems listed above:  ASCVD risk: CVD risk factors include HTN,  obesity, dyslipidemia, family history. By CT calcium scoring, coronary calcium score 685 with calcium noted throughout the LAD and RCA and to a lesser extent in the left circumflex. 10 year risk of CVD is 7.4%, 10 year risk with optimal risk factor modification 3.9%.  Lifetime CVD risk 50%, with optimal risk factor modification 5%.   With his CAC at 685 but asymptomatic will check exercise tolerance test. If negative will work on risk factor modification. If positive he will likely need a cardiac cath.   With elevated CAC score and 7.4% 10 year risk it is reasonable to initiate moderate intensity statin along with lifestyle modification per ACC/AHA guidelines. Adding aspirin could be considered, recent evidence is not strongly support this, but pt says that aspirin tends to exacerbate his gout so we will not add it at this time. Would need aspirin if he were to need cath and obstructive CAD found.   Advised on DASH diet and exercise- 150 minutes of accumulated moderate intensity exercise per week and decrease sedentary behaviors to reduce ASCVD risk.   Metabolic Syndrome  Dilated ascending aorta: Mildly dilated by CT- 4.1 cm by CTA 09/2017. Recommendation for annual imaging by CTA or MRA. I will place order.   Hypertension: Valsartan-HCTZ  160-25 mg - 1/2 pill daily. BP well controlled.   Obesity:  Body mass index is 32.26 kg/m. Ideally pt should obtain BMI less than 25 and wt <170 lbs. We discussed wt loss with exercise and diet with calorie reduction. He agrees that he is now sufficiently motivated to start with a goal of 20 lb wt loss to start with .   CKD stage III: recent SCr 1.29 in 08/2017. Pt is on an ARB. Pt advised on the importance of BP control.   Dyslipidemia:  Will initiate moderate intensity statin- Rosuvastatin 10 mg and recheck lipid panel and AST in 6 weeks- per PCP. LDL goal <70.   Snoring: Wife reports that he has always snored and she notes that he stops breathing at times.  He  is also fatigued by the end of the day. We discussed sleep apnea, sleep study and treatment and the pt would like to work on wt loss and lifestyle changes and will consider sleep study in the future. He is advised to just let us know and we can arrange it for him if he likes.     Medication Adjustments/Labs and Tests Ordered: Current medicines are reviewed at length with the patient today.  Concerns regarding medicines are outlined above. Labs and tests ordered and medication changes are outlined in the patient instructions below:  Patient Instructions  Medication Instructions: Your physician recommends that you continue on your current medications as directed. Please refer to the Current Medication list given to you today.   Labwork: None  Procedures/Testing: Your physician has requested that you have an exercise tolerance test. For further information please visit [email or web address]. Please also follow instruction sheet, as given.    Follow-Up: Your physician wants you to follow-up in: 1 yr with Dr.End You will receive a reminder letter in the mail two months in advance. If you don't receive a letter, please call our office to schedule the follow-up appointment.   Any Additional Special Instructions Will Be Listed Below (If Applicable).   DASH Eating Plan DASH stands for "Dietary Approaches to Stop Hypertension." The DASH eating plan is a healthy eating plan that has been shown to reduce high blood pressure (hypertension). It may also reduce your risk for type 2 diabetes, heart disease, and stroke. The DASH eating plan may also help with weight loss. What are tips for following this plan? General guidelines  Avoid eating more than 2,300 mg (milligrams) of salt (sodium) a day. If you have hypertension, you may need to reduce your sodium intake to 1,500 mg a day.  Limit alcohol intake to no more than 1 drink a day for nonpregnant women and 2 drinks a day for men. One drink equals 12  oz of beer, 5 oz of wine, or 1 oz of hard liquor.  Work with your health care provider to maintain a healthy body weight or to lose weight. Ask what an ideal weight is for you.  Get at least 30 minutes of exercise that causes your heart to beat faster (aerobic exercise) most days of the week. Activities may include walking, swimming, or biking.  Work with your health care provider or diet and nutrition specialist (dietitian) to adjust your eating plan to your individual calorie needs. Reading food labels  Check food labels for the amount of sodium per serving. Choose foods with less than 5 percent of the Daily Value of sodium. Generally, foods with less than 300 mg of sodium per serving fit into this eating plan.  To find whole grains, look for the word "whole" as the first word in the ingredient list. Shopping  Buy products labeled as "low-sodium" or "no salt added."  Buy fresh foods. Avoid canned foods and premade or frozen meals. Cooking  Avoid adding salt when cooking. Use salt-free seasonings or herbs instead of table salt or sea salt. Check with your health care provider or pharmacist before using salt substitutes.  Do not fry foods. Cook foods using healthy methods such as baking, boiling, grilling, and broiling instead.  Cook with heart-healthy oils, such as olive, canola, soybean, or sunflower oil. Meal planning   Eat a balanced diet that includes: ? 5 or more servings of fruits and vegetables each day. At each meal, try to fill half of your  plate with fruits and vegetables. ? Up to 6-8 servings of whole grains each day. ? Less than 6 oz of lean meat, poultry, or fish each day. A 3-oz serving of meat is about the same size as a deck of cards. One egg equals 1 oz. ? 2 servings of low-fat dairy each day. ? A serving of nuts, seeds, or beans 5 times each week. ? Heart-healthy fats. Healthy fats called Omega-3 fatty acids are found in foods such as flaxseeds and coldwater fish,  like sardines, salmon, and mackerel.  Limit how much you eat of the following: ? Canned or prepackaged foods. ? Food that is high in trans fat, such as fried foods. ? Food that is high in saturated fat, such as fatty meat. ? Sweets, desserts, sugary drinks, and other foods with added sugar. ? Full-fat dairy products.  Do not salt foods before eating.  Try to eat at least 2 vegetarian meals each week.  Eat more home-cooked food and less restaurant, buffet, and fast food.  When eating at a restaurant, ask that your food be prepared with less salt or no salt, if possible. What foods are recommended? The items listed may not be a complete list. Talk with your dietitian about what dietary choices are best for you. Grains Whole-grain or whole-wheat bread. Whole-grain or whole-wheat pasta. Brown rice. [place]. Bulgur. Whole-grain and low-sodium cereals. Pita bread. Low-fat, low-sodium crackers. Whole-wheat flour tortillas. Vegetables Fresh or frozen vegetables (raw, steamed, roasted, or grilled). Low-sodium or reduced-sodium tomato and vegetable juice. Low-sodium or reduced-sodium tomato sauce and tomato paste. Low-sodium or reduced-sodium canned vegetables. Fruits All fresh, dried, or frozen fruit. Canned fruit in natural juice (without added sugar). Meat and other protein foods Skinless chicken or [place]. Ground chicken or [place]. Pork with fat trimmed off. Fish and seafood. Egg whites. Dried beans, peas, or lentils. Unsalted nuts, nut butters, and seeds. Unsalted canned beans. Lean cuts of beef with fat trimmed off. Low-sodium, lean deli meat. Dairy Low-fat (1%) or fat-free (skim) milk. Fat-free, low-fat, or reduced-fat cheeses. Nonfat, low-sodium ricotta or cottage cheese. Low-fat or nonfat yogurt. Low-fat, low-sodium cheese. Fats and oils Soft margarine without trans fats. Vegetable oil. Low-fat, reduced-fat, or light mayonnaise and salad dressings (reduced-sodium). Canola,  safflower, olive, soybean, and sunflower oils. Avocado. Seasoning and other foods Herbs. Spices. Seasoning mixes without salt. Unsalted popcorn and pretzels. Fat-free sweets. What foods are not recommended? The items listed may not be a complete list. Talk with your dietitian about what dietary choices are best for you. Grains Baked goods made with fat, such as croissants, muffins, or some breads. Dry pasta or rice meal packs. Vegetables Creamed or fried vegetables. Vegetables in a cheese sauce. Regular canned vegetables (not low-sodium or reduced-sodium). Regular canned tomato sauce and paste (not low-sodium or reduced-sodium). Regular tomato and vegetable juice (not low-sodium or reduced-sodium). Angie Fava. Olives. Fruits Canned fruit in a light or heavy syrup. Fried fruit. Fruit in cream or butter sauce. Meat and other protein foods Fatty cuts of meat. Ribs. Fried meat. Berniece Salines. Sausage. Bologna and other processed lunch meats. Salami. Fatback. Hotdogs. Bratwurst. Salted nuts and seeds. Canned beans with added salt. Canned or smoked fish. Whole eggs or egg yolks. Chicken or [place] with skin. Dairy Whole or 2% milk, cream, and half-and-half. Whole or full-fat cream cheese. Whole-fat or sweetened yogurt. Full-fat cheese. Nondairy creamers. Whipped toppings. Processed cheese and cheese spreads. Fats and oils Butter. Stick margarine. Lard. Shortening. Ghee. Bacon fat. Tropical oils, such as  coconut, palm kernel, or palm oil. Seasoning and other foods Salted popcorn and pretzels. Onion salt, garlic salt, seasoned salt, table salt, and sea salt. Worcestershire sauce. Tartar sauce. Barbecue sauce. Teriyaki sauce. Soy sauce, including reduced-sodium. Steak sauce. Canned and packaged gravies. Fish sauce. Oyster sauce. Cocktail sauce. Horseradish that you find on the shelf. Ketchup. Mustard. Meat flavorings and tenderizers. Bouillon cubes. Hot sauce and Tabasco sauce. Premade or packaged marinades. Premade or  packaged taco seasonings. Relishes. Regular salad dressings. Where to find more information:  National Heart, Lung, and [place]: https://wilson-eaton.com/  American Heart Association: www.heart.org Summary  The DASH eating plan is a healthy eating plan that has been shown to reduce high blood pressure (hypertension). It may also reduce your risk for type 2 diabetes, heart disease, and stroke.  With the DASH eating plan, you should limit salt (sodium) intake to 2,300 mg a day. If you have hypertension, you may need to reduce your sodium intake to 1,500 mg a day.  When on the DASH eating plan, aim to eat more fresh fruits and vegetables, whole grains, lean proteins, low-fat dairy, and heart-healthy fats.  Work with your health care provider or diet and nutrition specialist (dietitian) to adjust your eating plan to your individual calorie needs. This information is not intended to replace advice given to you by your health care provider. Make sure you discuss any questions you have with your health care provider. Document Released: 04/15/2011 Document Revised: 04/19/2016 Document Reviewed: 04/19/2016 Elsevier Interactive Patient Education  2018 [place].    Heart Disease Prevention Heart disease is a leading cause of death. There are many things you can do to help prevent heart disease. Be physically active Physical activity is good for your heart. It helps control your blood pressure, cholesterol levels, and weight. Try to be physically active every day. Ask your health care provider what activities are best for you. Be a healthy weight Extra weight can strain your heart and affect your blood pressure and cholesterol levels. Lose weight with diet and exercise if recommended by your health care provider. Eat heart-healthy foods Follow a healthy eating plan as recommended by your health care provider or dietitian. Heart-healthy foods include:  High-fiber foods. These include oat bran,  oatmeal, and whole-grain breads and cereals.  Fruits and vegetables.  Avoid:  Alcohol.  Fried foods.  Foods high in saturated fat. These include meats, butter, whole dairy products, shortening, and coconut or palm oil.  Salty foods. These include canned food, luncheon meat, salty snacks, and fast food.  Keep your cholesterol levels under control Cholesterol is a substance that is used for many important functions. When your cholesterol levels are high, cholesterol can stick to the insides of your blood vessels, making them narrow or clog. This can lead to chest pain (angina) and a heart attack. Keep your cholesterol levels under control as recommended by your health care provider. Have your cholesterol checked at least once a year. Target cholesterol levels (in mg/dL) for most people are:  Total cholesterol below 200.  LDL cholesterol below 100.  HDL cholesterol above 40 in men and above 50 in women.  Triglycerides below 150.  Keep your blood pressure under control Having high blood pressure (hypertension) puts you at risk for stroke and other forms of heart disease. Keep your blood pressure under control as recommended by your health care provider. Ask your health care provider if you need treatment to lower your blood pressure. If you are 18-39 years of  age, have your blood pressure checked every 3-5 years. If you are 59 years of age or older, have your blood pressure checked every year. Do not use tobacco products Tobacco smoke can damage your heart and blood vessels. Do not use any tobacco products including cigarettes, chewing tobacco, or electronic cigarettes. If you need help quitting, ask your health care provider. Take medicines as directed Take medicines only as directed by your health care provider. Ask your health care provider whether you should take an aspirin every day. Taking aspirin can help reduce your risk of heart disease and stroke. Where to find more  information: To find out more about heart disease, visit the American Heart Association's website at www.americanheart.org This information is not intended to replace advice given to you by your health care provider. Make sure you discuss any questions you have with your health care provider. Document Released: 12/09/2003 Document Revised: 09/24/2015 Document Reviewed: 06/20/2013 Elsevier Interactive Patient Education  [employer].    If you need a refill on your cardiac medications before your next appointment, please call your pharmacy.       Signed, Daune Perch, NP  10/27/2017 2:51 PM    [facility]

## 2017-10-27 ENCOUNTER — Ambulatory Visit: Payer: BLUE CROSS/BLUE SHIELD | Admitting: Cardiology

## 2017-10-27 VITALS — BP 126/74 | HR 63 | Ht 70.0 in | Wt 224.8 lb

## 2017-10-27 DIAGNOSIS — R0683 Snoring: Secondary | ICD-10-CM

## 2017-10-27 DIAGNOSIS — Z6832 Body mass index (BMI) 32.0-32.9, adult: Secondary | ICD-10-CM

## 2017-10-27 DIAGNOSIS — N183 Chronic kidney disease, stage 3 unspecified: Secondary | ICD-10-CM

## 2017-10-27 DIAGNOSIS — E8881 Metabolic syndrome: Secondary | ICD-10-CM | POA: Diagnosis not present

## 2017-10-27 DIAGNOSIS — R931 Abnormal findings on diagnostic imaging of heart and coronary circulation: Secondary | ICD-10-CM | POA: Diagnosis not present

## 2017-10-27 DIAGNOSIS — I712 Thoracic aortic aneurysm, without rupture, unspecified: Secondary | ICD-10-CM | POA: Insufficient documentation

## 2017-10-27 DIAGNOSIS — E785 Hyperlipidemia, unspecified: Secondary | ICD-10-CM | POA: Diagnosis not present

## 2017-10-27 DIAGNOSIS — I10 Essential (primary) hypertension: Secondary | ICD-10-CM | POA: Diagnosis not present

## 2017-10-27 DIAGNOSIS — E6609 Other obesity due to excess calories: Secondary | ICD-10-CM | POA: Diagnosis not present

## 2017-10-27 NOTE — Patient Instructions (Addendum)
Medication Instructions: Your physician recommends that you continue on your current medications as directed. Please refer to the Current Medication list given to you today.   Labwork: None  Procedures/Testing: Your physician has requested that you have an exercise tolerance test. For further information please visit [email or web address]. Please also follow instruction sheet, as given.    Follow-Up: Your physician wants you to follow-up in: 1 yr with Dr.End You will receive a reminder letter in the mail two months in advance. If you don't receive a letter, please call our office to schedule the follow-up appointment.   Any Additional Special Instructions Will Be Listed Below (If Applicable).   DASH Eating Plan DASH stands for "Dietary Approaches to Stop Hypertension." The DASH eating plan is a healthy eating plan that has been shown to reduce high blood pressure (hypertension). It may also reduce your risk for type 2 diabetes, heart disease, and stroke. The DASH eating plan may also help with weight loss. What are tips for following this plan? General guidelines  Avoid eating more than 2,300 mg (milligrams) of salt (sodium) a day. If you have hypertension, you may need to reduce your sodium intake to 1,500 mg a day.  Limit alcohol intake to no more than 1 drink a day for nonpregnant women and 2 drinks a day for men. One drink equals 12 oz of beer, 5 oz of wine, or 1 oz of hard liquor.  Work with your health care provider to maintain a healthy body weight or to lose weight. Ask what an ideal weight is for you.  Get at least 30 minutes of exercise that causes your heart to beat faster (aerobic exercise) most days of the week. Activities may include walking, swimming, or biking.  Work with your health care provider or diet and nutrition specialist (dietitian) to adjust your eating plan to your individual calorie needs. Reading food labels  Check food labels for the amount of sodium  per serving. Choose foods with less than 5 percent of the Daily Value of sodium. Generally, foods with less than 300 mg of sodium per serving fit into this eating plan.  To find whole grains, look for the word "whole" as the first word in the ingredient list. Shopping  Buy products labeled as "low-sodium" or "no salt added."  Buy fresh foods. Avoid canned foods and premade or frozen meals. Cooking  Avoid adding salt when cooking. Use salt-free seasonings or herbs instead of table salt or sea salt. Check with your health care provider or pharmacist before using salt substitutes.  Do not fry foods. Cook foods using healthy methods such as baking, boiling, grilling, and broiling instead.  Cook with heart-healthy oils, such as olive, canola, soybean, or sunflower oil. Meal planning   Eat a balanced diet that includes: ? 5 or more servings of fruits and vegetables each day. At each meal, try to fill half of your plate with fruits and vegetables. ? Up to 6-8 servings of whole grains each day. ? Less than 6 oz of lean meat, poultry, or fish each day. A 3-oz serving of meat is about the same size as a deck of cards. One egg equals 1 oz. ? 2 servings of low-fat dairy each day. ? A serving of nuts, seeds, or beans 5 times each week. ? Heart-healthy fats. Healthy fats called Omega-3 fatty acids are found in foods such as flaxseeds and coldwater fish, like sardines, salmon, and mackerel.  Limit how much you eat of the following: ?  Canned or prepackaged foods. ? Food that is high in trans fat, such as fried foods. ? Food that is high in saturated fat, such as fatty meat. ? Sweets, desserts, sugary drinks, and other foods with added sugar. ? Full-fat dairy products.  Do not salt foods before eating.  Try to eat at least 2 vegetarian meals each week.  Eat more home-cooked food and less restaurant, buffet, and fast food.  When eating at a restaurant, ask that your food be prepared with less  salt or no salt, if possible. What foods are recommended? The items listed may not be a complete list. Talk with your dietitian about what dietary choices are best for you. Grains Whole-grain or whole-wheat bread. Whole-grain or whole-wheat pasta. Brown rice. [place]. Bulgur. Whole-grain and low-sodium cereals. Pita bread. Low-fat, low-sodium crackers. Whole-wheat flour tortillas. Vegetables Fresh or frozen vegetables (raw, steamed, roasted, or grilled). Low-sodium or reduced-sodium tomato and vegetable juice. Low-sodium or reduced-sodium tomato sauce and tomato paste. Low-sodium or reduced-sodium canned vegetables. Fruits All fresh, dried, or frozen fruit. Canned fruit in natural juice (without added sugar). Meat and other protein foods Skinless chicken or [place]. Ground chicken or [place]. Pork with fat trimmed off. Fish and seafood. Egg whites. Dried beans, peas, or lentils. Unsalted nuts, nut butters, and seeds. Unsalted canned beans. Lean cuts of beef with fat trimmed off. Low-sodium, lean deli meat. Dairy Low-fat (1%) or fat-free (skim) milk. Fat-free, low-fat, or reduced-fat cheeses. Nonfat, low-sodium ricotta or cottage cheese. Low-fat or nonfat yogurt. Low-fat, low-sodium cheese. Fats and oils Soft margarine without trans fats. Vegetable oil. Low-fat, reduced-fat, or light mayonnaise and salad dressings (reduced-sodium). Canola, safflower, olive, soybean, and sunflower oils. Avocado. Seasoning and other foods Herbs. Spices. Seasoning mixes without salt. Unsalted popcorn and pretzels. Fat-free sweets. What foods are not recommended? The items listed may not be a complete list. Talk with your dietitian about what dietary choices are best for you. Grains Baked goods made with fat, such as croissants, muffins, or some breads. Dry pasta or rice meal packs. Vegetables Creamed or fried vegetables. Vegetables in a cheese sauce. Regular canned vegetables (not low-sodium or  reduced-sodium). Regular canned tomato sauce and paste (not low-sodium or reduced-sodium). Regular tomato and vegetable juice (not low-sodium or reduced-sodium). Angie Fava. Olives. Fruits Canned fruit in a light or heavy syrup. Fried fruit. Fruit in cream or butter sauce. Meat and other protein foods Fatty cuts of meat. Ribs. Fried meat. Berniece Salines. Sausage. Bologna and other processed lunch meats. Salami. Fatback. Hotdogs. Bratwurst. Salted nuts and seeds. Canned beans with added salt. Canned or smoked fish. Whole eggs or egg yolks. Chicken or [place] with skin. Dairy Whole or 2% milk, cream, and half-and-half. Whole or full-fat cream cheese. Whole-fat or sweetened yogurt. Full-fat cheese. Nondairy creamers. Whipped toppings. Processed cheese and cheese spreads. Fats and oils Butter. Stick margarine. Lard. Shortening. Ghee. Bacon fat. Tropical oils, such as coconut, palm kernel, or palm oil. Seasoning and other foods Salted popcorn and pretzels. Onion salt, garlic salt, seasoned salt, table salt, and sea salt. Worcestershire sauce. Tartar sauce. Barbecue sauce. Teriyaki sauce. Soy sauce, including reduced-sodium. Steak sauce. Canned and packaged gravies. Fish sauce. Oyster sauce. Cocktail sauce. Horseradish that you find on the shelf. Ketchup. Mustard. Meat flavorings and tenderizers. Bouillon cubes. Hot sauce and Tabasco sauce. Premade or packaged marinades. Premade or packaged taco seasonings. Relishes. Regular salad dressings. Where to find more information:  National Heart, Lung, and [place]: https://wilson-eaton.com/  American Heart Association: www.heart.org Summary  The DASH eating plan is a healthy eating plan that has been shown to reduce high blood pressure (hypertension). It may also reduce your risk for type 2 diabetes, heart disease, and stroke.  With the DASH eating plan, you should limit salt (sodium) intake to 2,300 mg a day. If you have hypertension, you may need to reduce your sodium  intake to 1,500 mg a day.  When on the DASH eating plan, aim to eat more fresh fruits and vegetables, whole grains, lean proteins, low-fat dairy, and heart-healthy fats.  Work with your health care provider or diet and nutrition specialist (dietitian) to adjust your eating plan to your individual calorie needs. This information is not intended to replace advice given to you by your health care provider. Make sure you discuss any questions you have with your health care provider. Document Released: 04/15/2011 Document Revised: 04/19/2016 Document Reviewed: 04/19/2016 Elsevier Interactive Patient Education  2018 [place].    Heart Disease Prevention Heart disease is a leading cause of death. There are many things you can do to help prevent heart disease. Be physically active Physical activity is good for your heart. It helps control your blood pressure, cholesterol levels, and weight. Try to be physically active every day. Ask your health care provider what activities are best for you. Be a healthy weight Extra weight can strain your heart and affect your blood pressure and cholesterol levels. Lose weight with diet and exercise if recommended by your health care provider. Eat heart-healthy foods Follow a healthy eating plan as recommended by your health care provider or dietitian. Heart-healthy foods include:  High-fiber foods. These include oat bran, oatmeal, and whole-grain breads and cereals.  Fruits and vegetables.  Avoid:  Alcohol.  Fried foods.  Foods high in saturated fat. These include meats, butter, whole dairy products, shortening, and coconut or palm oil.  Salty foods. These include canned food, luncheon meat, salty snacks, and fast food.  Keep your cholesterol levels under control Cholesterol is a substance that is used for many important functions. When your cholesterol levels are high, cholesterol can stick to the insides of your blood vessels, making them narrow  or clog. This can lead to chest pain (angina) and a heart attack. Keep your cholesterol levels under control as recommended by your health care provider. Have your cholesterol checked at least once a year. Target cholesterol levels (in mg/dL) for most people are:  Total cholesterol below 200.  LDL cholesterol below 100.  HDL cholesterol above 40 in men and above 50 in women.  Triglycerides below 150.  Keep your blood pressure under control Having high blood pressure (hypertension) puts you at risk for stroke and other forms of heart disease. Keep your blood pressure under control as recommended by your health care provider. Ask your health care provider if you need treatment to lower your blood pressure. If you are 78-39 years of age, have your blood pressure checked every 3-5 years. If you are 29 years of age or older, have your blood pressure checked every year. Do not use tobacco products Tobacco smoke can damage your heart and blood vessels. Do not use any tobacco products including cigarettes, chewing tobacco, or electronic cigarettes. If you need help quitting, ask your health care provider. Take medicines as directed Take medicines only as directed by your health care provider. Ask your health care provider whether you should take an aspirin every day. Taking aspirin can help reduce your risk of heart disease and stroke. Where  to find more information: To find out more about heart disease, visit the American Heart Association's website at www.americanheart.org This information is not intended to replace advice given to you by your health care provider. Make sure you discuss any questions you have with your health care provider. Document Released: 12/09/2003 Document Revised: 09/24/2015 Document Reviewed: 06/20/2013 Elsevier Interactive Patient Education  [employer].    If you need a refill on your cardiac medications before your next appointment, please call your pharmacy.

## 2017-10-28 ENCOUNTER — Ambulatory Visit (INDEPENDENT_AMBULATORY_CARE_PROVIDER_SITE_OTHER): Payer: BLUE CROSS/BLUE SHIELD

## 2017-10-28 DIAGNOSIS — E8881 Metabolic syndrome: Secondary | ICD-10-CM

## 2017-10-28 LAB — EXERCISE TOLERANCE TEST
CHL RATE OF PERCEIVED EXERTION: 16
CSEPED: 9 min
CSEPEDS: 3 s
CSEPHR: 86 %
Estimated workload: 10.1 METS
MPHR: 164 {beats}/min
Peak HR: 142 {beats}/min
Rest HR: 62 {beats}/min

## 2018-01-05 NOTE — Telephone Encounter (Signed)
I called and spoke with Jon Frost and called his friend Jon Frost. Jon Frost will discuss a referral to our valve clinic with his current cardiologist.

## 2018-03-30 ENCOUNTER — Telehealth: Payer: Self-pay | Admitting: Oncology

## 2018-03-30 NOTE — Telephone Encounter (Signed)
Called to verify appointments

## 2018-03-31 ENCOUNTER — Inpatient Hospital Stay: Payer: BLUE CROSS/BLUE SHIELD | Attending: Oncology | Admitting: Oncology

## 2018-03-31 ENCOUNTER — Inpatient Hospital Stay: Payer: BLUE CROSS/BLUE SHIELD

## 2018-03-31 VITALS — BP 142/90 | HR 56 | Temp 97.8°F | Resp 17 | Ht 70.0 in | Wt 227.9 lb

## 2018-03-31 DIAGNOSIS — Z85048 Personal history of other malignant neoplasm of rectum, rectosigmoid junction, and anus: Secondary | ICD-10-CM

## 2018-03-31 DIAGNOSIS — Z87442 Personal history of urinary calculi: Secondary | ICD-10-CM | POA: Diagnosis not present

## 2018-03-31 DIAGNOSIS — M109 Gout, unspecified: Secondary | ICD-10-CM

## 2018-03-31 DIAGNOSIS — C20 Malignant neoplasm of rectum: Secondary | ICD-10-CM

## 2018-03-31 DIAGNOSIS — Z800 Family history of malignant neoplasm of digestive organs: Secondary | ICD-10-CM | POA: Diagnosis not present

## 2018-03-31 LAB — CEA (IN HOUSE-CHCC): CEA (CHCC-In House): 2.22 ng/mL (ref 0.00–5.00)

## 2018-03-31 NOTE — Progress Notes (Signed)
  [facility] OFFICE PROGRESS NOTE   Diagnosis: Rectal cancer  INTERVAL HISTORY:   Mr. Fausto returns as scheduled.  He feels well.  Good appetite.  He had a flare of gout in the left great toe this week.  This improved with colchicine.  No other complaint.  Objective:  Vital signs in last 24 hours:  Blood pressure (!) 142/90, pulse (!) 56, temperature 97.8 F (36.6 C), temperature source Oral, resp. rate 17, height '5\' 10"'$  (1.778 m), weight 227 lb 14.4 oz (103.4 kg), SpO2 98 %.    HEENT: Neck without mass Lymphatics: No cervical, supraclavicular, axillary, or inguinal nodes Resp: Lungs clear bilaterally Cardio: Regular rate and rhythm GI: No hepatosplenomegaly, no mass, nontender.  There is a reducible right inguinal hernia extending into the right scrotum Vascular: No leg edema  Musculoskeletal: Minimal induration at the left first MTP joint    Lab Results:    Lab Results  Component Value Date   CEA1 2.22 03/31/2018     Medications: I have reviewed the patient's current medications.   Assessment/Plan: 1. Rectal cancer, stage II (T3 N0), proximal rectum, status post a low anterior resection 11/13/2015 ? Microsatellite stable, no loss of mismatch repair protein expression ? 15 negative lymph nodes, no lymphovascular or perineural invasion ? Grade 1 ? Colonoscopy 04/09/2016-polyp was removed from the descending and transverse colon- tubular adenomas and sessile serrated polyp, polyp at the sigmoid anastomosis-polypoid colonic mucosa  2. History of kidney stones  3. Family history of colon cancer  4. Multiple polyps noted on the colonoscopy 10/08/2015  5.Gout     Disposition: Mr. Ungaro remains in clinical remission from rectal cancer.  He will return for an office visit and CEA in 1 year.  He will be scheduled for a colonoscopy with Dr. Watt Climes in 2020. He appears to have a right inguinal hernia.  He will schedule an appoint with Dr.  Johney Maine to evaluate the hernia.  15 minutes were spent with the patient today.  The majority of the time was used for counseling and coordination of care.  Betsy Coder, MD  03/31/2018  1:56 PM

## 2018-04-03 ENCOUNTER — Telehealth: Payer: Self-pay | Admitting: *Deleted

## 2018-04-03 NOTE — Telephone Encounter (Signed)
-----   Message from Ladell Pier, MD sent at 03/31/2018  1:58 PM EST ----- Please call patient, CEA is normal

## 2018-04-03 NOTE — Telephone Encounter (Signed)
Notified of normal CEA. No need to worry--if result makes dramatic change from one reading to another, then MD will follow up as necessary.

## 2018-04-11 ENCOUNTER — Ambulatory Visit: Payer: Self-pay | Admitting: Surgery

## 2018-04-11 ENCOUNTER — Encounter: Payer: Self-pay | Admitting: Surgery

## 2018-04-11 NOTE — H&P (Signed)
Jon Frost Documented: 04/11/2018 11:07 AM Location: [facility] Patient #: [id number] DOB: 1960/09/27 Married / Language: Jon Frost / Race: White Male  History of Present Illness Jon Hector MD; 04/11/2018 11:41 AM) The patient is a 57 year old male who presents with an inguinal hernia. Note for "Inguinal hernia": ` ` ` Patient sent for surgical consultation at the request of Dr Jon Frost  Chief Complaint: New right inguinal hernia. ` ` The patient is a pleasant gentleman. I resected a rectal cancer in 2017. Robotically assisted with extraction wound and right paramedian region T3 N0. He's had follow-up colonoscopy with benign polyps removed. No evidence of cancer recurrence. He was incidentally noted to have a right inguinal hernia on his follow-up visit with his medical oncologist. Surgical consultation requested.  Patient comes today with his wife. He is noticed some increasing discomfort and sensitivity. No severe sharp pains but has gotten much larger in the past 3 months. Usually moves his bowels a couple times a day. Sometimes more often. Trying to stand Metamucil which helps regulate things. No problems with urination erection or ejaculation. He remains physically active. Does not smoke. He has regained his 20 pounds of weight. BMI 32. No nausea or vomiting.  (Review of systems as stated in this history (HPI) or in the review of systems. Otherwise all other 12 point ROS are negative) ` ` `   Allergies (Jon Frost, RMA; 04/11/2018 11:08 AM) No Known Drug Allergies [10/09/2015]: Allergies Reconciled  Medication History [employer], RMA; 04/11/2018 11:08 AM) Valsartan-hydroCHLOROthiazide (80-12.5MG  Tablet, Oral) Active. Fish Oil (1360MG  Capsule, Oral) Active. Multi Vitamin (Oral) Active. Rosuvastatin Calcium (10MG  Tablet, Oral) Active. Tamsulosin HCl (0.4MG  Capsule, Oral) Active. Medications Reconciled    Vitals  (Jon Frost RMA; 04/11/2018 11:09 AM) 04/11/2018 11:08 AM Weight: 229 lb Height: 71in Body Surface Area: 2.23 m Body Mass Index: 31.94 kg/m  Temp.: 97.5F(Temporal)  Pulse: 67 (Regular)  P.OX: 96% (Room air) BP: 126/80 (Sitting, Right Arm, Standard)      Physical Exam Jon Hector MD; 04/11/2018 11:30 AM)  General Mental Status-Alert. General Appearance-Not in acute distress, Not Sickly. Orientation-Oriented X3. Hydration-Well hydrated. Voice-Normal.  Integumentary Global Assessment Upon inspection and palpation of skin surfaces of the - Axillae: non-tender, no inflammation or ulceration, no drainage. and Distribution of scalp and body hair is normal. General Characteristics Temperature - normal warmth is noted.  Head and Neck Head-normocephalic, atraumatic with no lesions or palpable masses. Face Global Assessment - atraumatic, no absence of expression. Neck Global Assessment - no abnormal movements, no bruit auscultated on the right, no bruit auscultated on the left, no decreased range of motion, non-tender. Trachea-midline. Thyroid Gland Characteristics - non-tender.  Eye Eyeball - Left-Extraocular movements intact, No Nystagmus. Eyeball - Right-Extraocular movements intact, No Nystagmus. Cornea - Left-No Hazy. Cornea - Right-No Hazy. Sclera/Conjunctiva - Left-No scleral icterus, No Discharge. Sclera/Conjunctiva - Right-No scleral icterus, No Discharge. Pupil - Left-Direct reaction to light normal. Pupil - Right-Direct reaction to light normal.  ENMT Ears Pinna - Left - no drainage observed, no generalized tenderness observed. Right - no drainage observed, no generalized tenderness observed. Nose and Sinuses External Inspection of the Nose - no destructive lesion observed. Inspection of the nares - Left - quiet respiration. Right - quiet respiration. Mouth and Throat Lips - Upper Lip - no fissures observed,  no pallor noted. Lower Lip - no fissures observed, no pallor noted. Nasopharynx - no discharge present. Oral Cavity/Oropharynx - Tongue - no dryness observed.  Oral Mucosa - no cyanosis observed. Hypopharynx - no evidence of airway distress observed.  Chest and Lung Exam Inspection Movements - Normal and Symmetrical. Accessory muscles - No use of accessory muscles in breathing. Palpation Palpation of the chest reveals - Non-tender. Auscultation Breath sounds - Normal and Clear.  Cardiovascular Auscultation Rhythm - Regular. Murmurs & Other Heart Sounds - Auscultation of the heart reveals - No Murmurs and No Systolic Clicks.  Abdomen Inspection Inspection of the abdomen reveals - No Visible peristalsis and No Abnormal pulsations. Umbilicus - No Bleeding, No Urine drainage. Palpation/Percussion Palpation and Percussion of the abdomen reveal - Soft, Non Tender, No Rebound tenderness, No Rigidity (guarding) and No Cutaneous hyperesthesia. Note: Obese but soft. Moderate suprapubic umbilical diastases recti. Right paramedian and other incisions well-healed. No umbilical hernia. Nontender nondistended.  Male Genitourinary Sexual Maturity Tanner 5 - Adult hair pattern and Adult penile size and shape. Note: Obvious right groin swelling going partially down the scrotum consistent with large right inguinal hernia.  No definite left-sided hernia. Otherwise normal external genitalia. No inguinal hernias. No lymphadenopathy.  Peripheral Vascular Upper Extremity Inspection - Left - No Cyanotic nailbeds, Not Ischemic. Right - No Cyanotic nailbeds, Not Ischemic.  Neurologic Neurologic evaluation reveals -normal attention span and ability to concentrate, able to name objects and repeat phrases. Appropriate fund of knowledge , normal sensation and normal coordination. Mental Status Affect - not angry, not paranoid. Cranial Nerves-Normal  Bilaterally. Gait-Normal.  Neuropsychiatric Mental status exam performed with findings of-able to articulate well with normal speech/language, rate, volume and coherence, thought content normal with ability to perform basic computations and apply abstract reasoning and no evidence of hallucinations, delusions, obsessions or homicidal/suicidal ideation. Note: Quiet. Somewhat grim face.  Musculoskeletal Global Assessment Spine, Ribs and Pelvis - no instability, subluxation or laxity. Right Upper Extremity - no instability, subluxation or laxity.  Lymphatic Head & Neck  General Head & Neck Lymphatics: Bilateral - Description - No Localized lymphadenopathy. Axillary  General Axillary Region: Bilateral - Description - No Localized lymphadenopathy. Femoral & Inguinal  Generalized Femoral & Inguinal Lymphatics: Left - Description - No Localized lymphadenopathy. Right - Description - No Localized lymphadenopathy.    Assessment & Plan Jon Hector MD; 04/11/2018 11:33 AM)  Carolynn Serve HERNIA (K40.90) Impression: Rather large right inguinal hernia going down to the scrotum. Sensitive but mostly reducible.  I think he would benefit from surgical repair. Reasonable start laparoscopically. Allow me to look on the contralateral left side as well. Often have to use extra sheets of mesh given his obesity and large hernia size.   PREOP - ING HERNIA - ENCOUNTER FOR PREOPERATIVE EXAMINATION FOR GENERAL SURGICAL PROCEDURE (Z01.818)  Current Plans You are being scheduled for surgery- Our schedulers will call you.  You should hear from our office's scheduling department within 5 working days about the location, date, and time of surgery. We try to make accommodations for patient's preferences in scheduling surgery, but sometimes the OR schedule or the surgeon's schedule prevents [place] from making those accommodations.  If you have not heard from our office [phone]) in 5 working days, call  the office and ask for your surgeon's nurse.  If you have other questions about your diagnosis, plan, or surgery, call the office and ask for your surgeon's nurse.  Written instructions provided The anatomy & physiology of the abdominal wall and pelvic floor was discussed. The pathophysiology of hernias in the inguinal and pelvic region was discussed. Natural history risks such as progressive enlargement,  pain, incarceration, and strangulation was discussed. Contributors to complications such as smoking, obesity, diabetes, prior surgery, etc were discussed.  I feel the risks of no intervention will lead to serious problems that outweigh the operative risks; therefore, I recommended surgery to reduce and repair the hernia. I explained laparoscopic techniques with possible need for an open approach. I noted usual use of mesh to patch and/or buttress hernia repair  Risks such as bleeding, infection, abscess, need for further treatment, heart attack, death, and other risks were discussed. I noted a good likelihood this will help address the problem. Goals of post-operative recovery were discussed as well. Possibility that this will not correct all symptoms was explained. I stressed the importance of low-impact activity, aggressive pain control, avoiding constipation, & not pushing through pain to minimize risk of post-operative chronic pain or injury. Possibility of reherniation was discussed. We will work to minimize complications.  An educational handout further explaining the pathology & treatment options was given as well. Questions were answered. The patient expresses understanding & wishes to proceed with surgery.  Pt Education - Pamphlet Given - Laparoscopic Hernia Repair: discussed with patient and provided information. Pt Education - [facility] Pain Control (Abdelaziz Westenberger) Pt Education - [facility] Hernia Post-Op HCI (Javohn Basey): discussed with patient and provided information. Pt Education - [facility] Mesh  education: discussed with patient and provided information.  HISTORY OF RECTAL CANCER (Z85.048) Impression: No evidence of recurrence.  Follow-up endoscopy per his gastroenterologist and medical oncologist.  Jon Hector, MD, FACS, MASCRS Gastrointestinal and Minimally Invasive Surgery    [address], [place], [place] [address] [phone] Main / Paging [phone] Fax

## 2018-04-12 IMAGING — CT CT ABD-PELV W/ CM
3 of 5 series · 12 of 36 positions shown, 18 images · IV contrast (READICAT/WATER & [ID] ISOVUE 300)
Comparison: None.

CLINICAL DATA: Change in bowel habits and pelvic pain for 6 months.
Hematochezia. Sigmoid colon mass seen on recent colonoscopy.

EXAM:
CT ABDOMEN AND PELVIS WITH CONTRAST
TECHNIQUE: Multidetector CT imaging of the abdomen and pelvis was performed
using the standard protocol following bolus administration of
intravenous contrast.
CONTRAST:  125mL 7WJRX2-088 IOPAMIDOL ([id number]) INJECTION 61%

[Series 3: abd/pelvis with · axial · 0.88mm/px · z∈[-374,-44]mm · 7 of 88 slices shown, 12 images]
[im 11/88  soft-tissue]
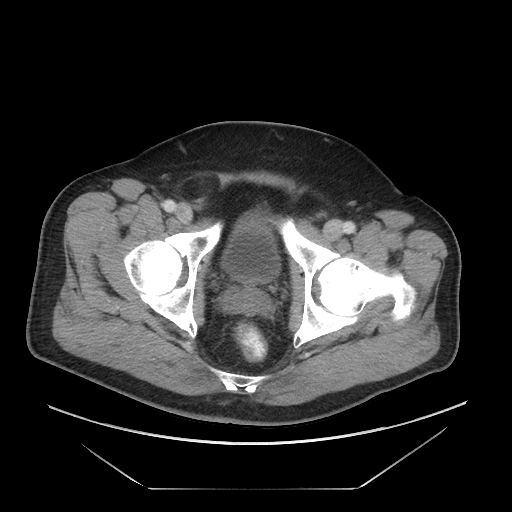
[im 11/88  bone]
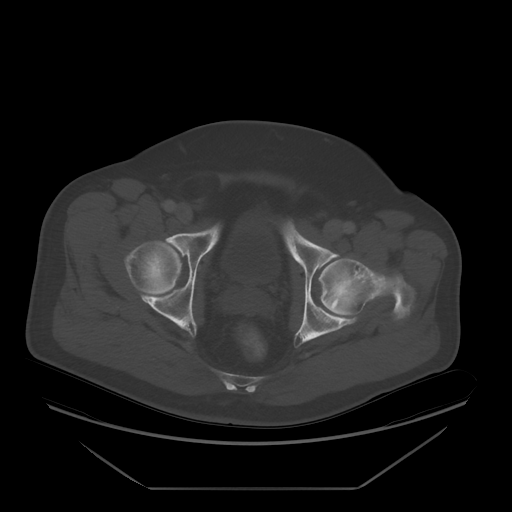
[im 22/88  soft-tissue]
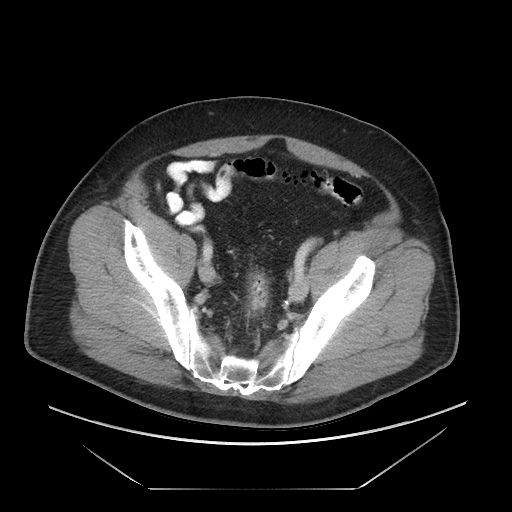
[im 33/88  soft-tissue]
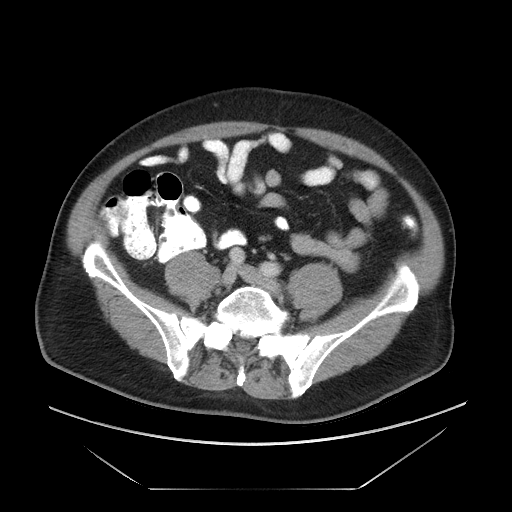
[im 44/88  soft-tissue]
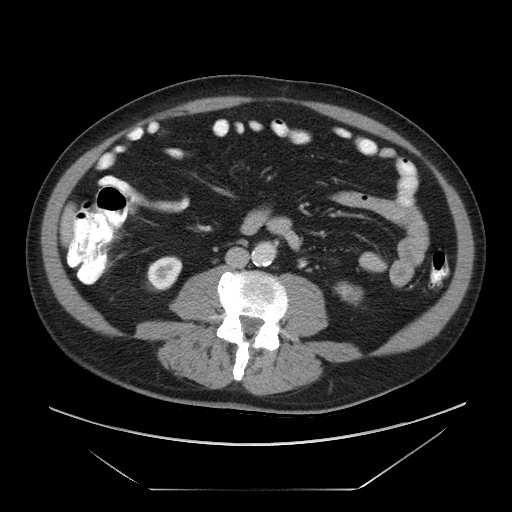
[im 44/88  lung]
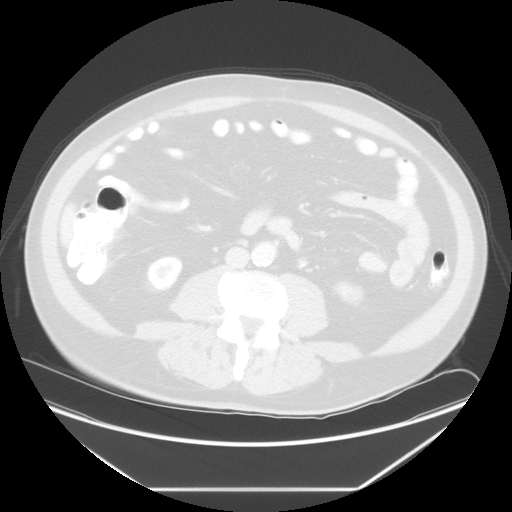
[im 55/88  soft-tissue]
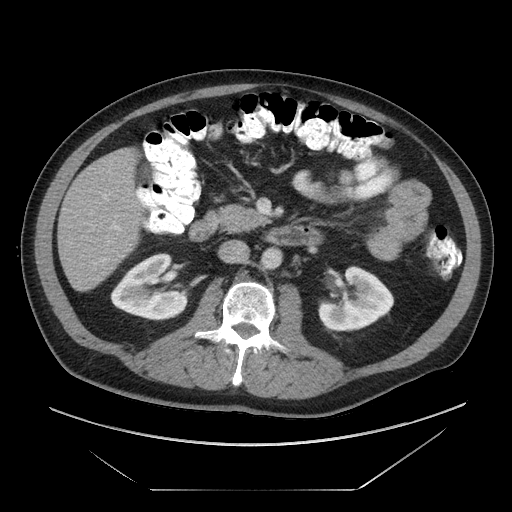
[im 55/88  lung]
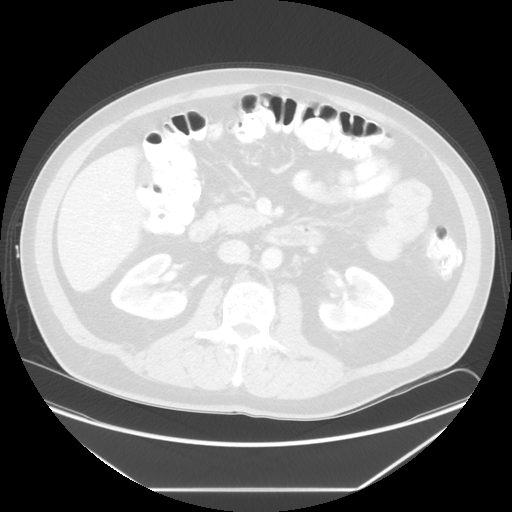
[im 66/88  soft-tissue]
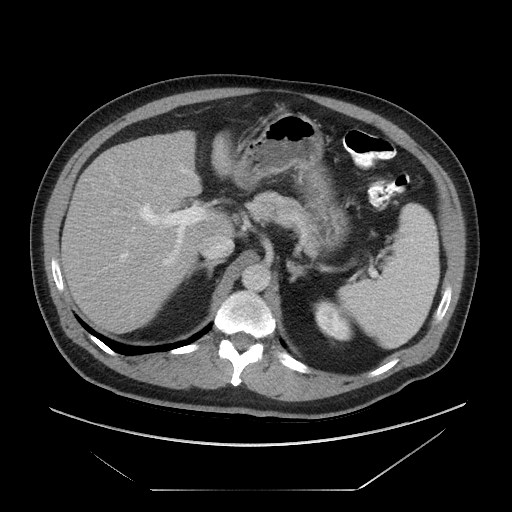
[im 66/88  lung]
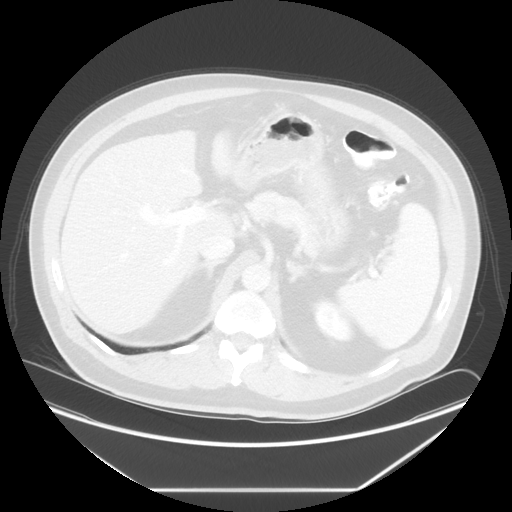
[im 77/88  soft-tissue]
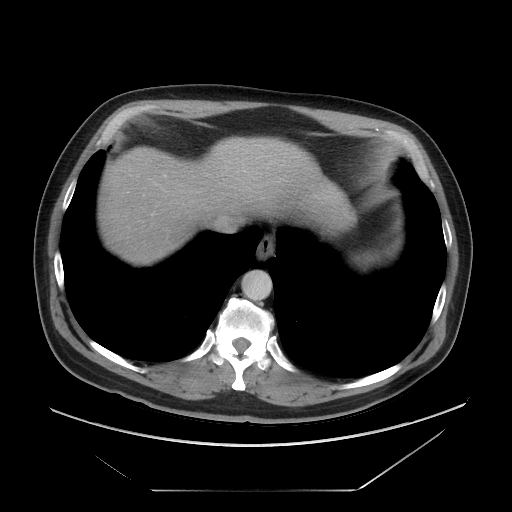
[im 77/88  lung]
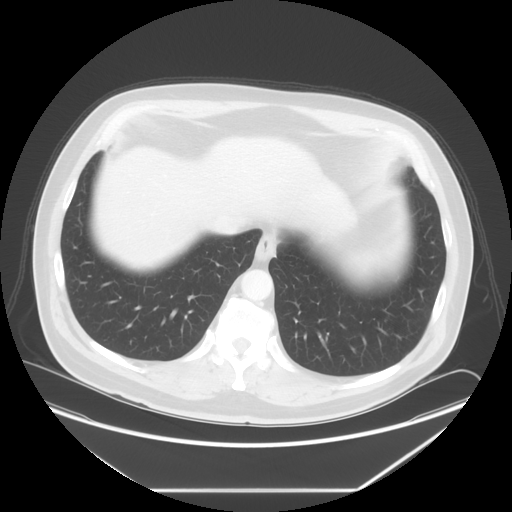

[Series 601: coronal body · coronal · 0.95mm/px · 1 of 129 slices shown, 2 images]
[im 43/129  soft-tissue]
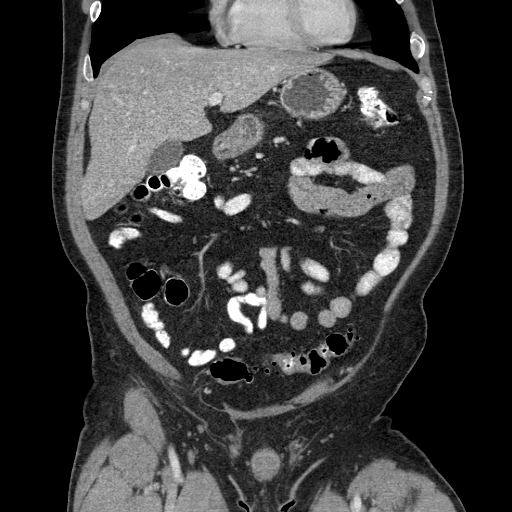
[im 43/129  bone]
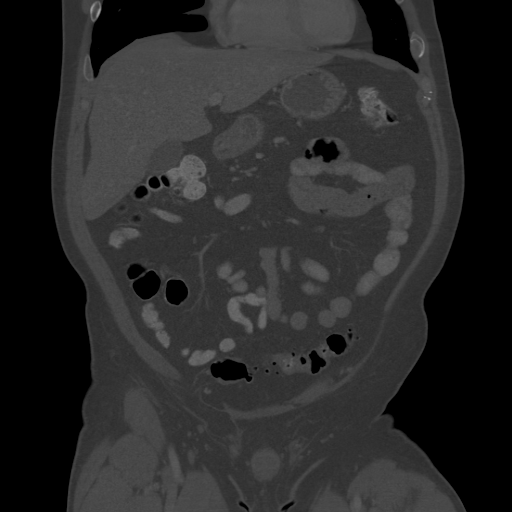

[Series 602: sagittal body · sagittal · 0.95mm/px · 4 of 170 slices shown]
[im 11/170  soft-tissue]
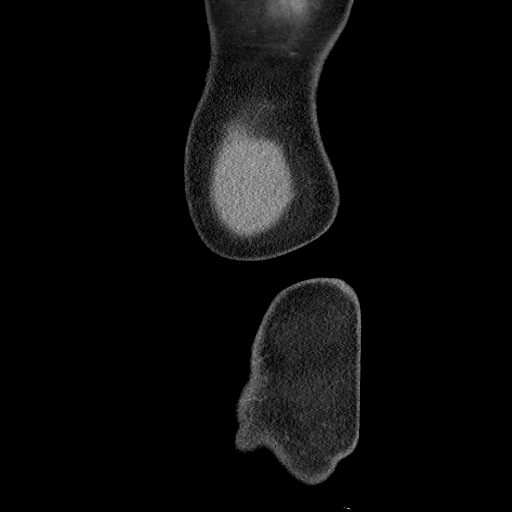
[im 32/170  soft-tissue]
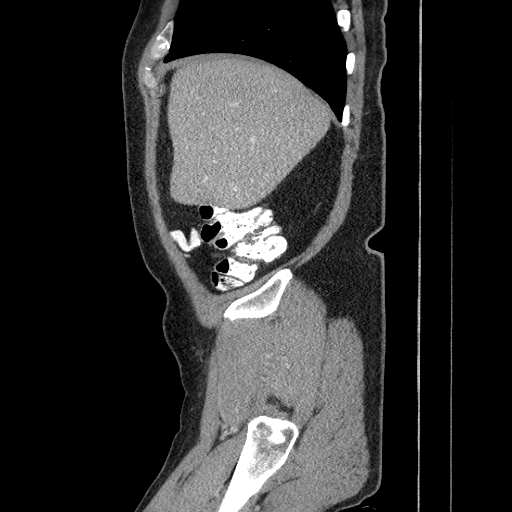
[im 53/170  soft-tissue]
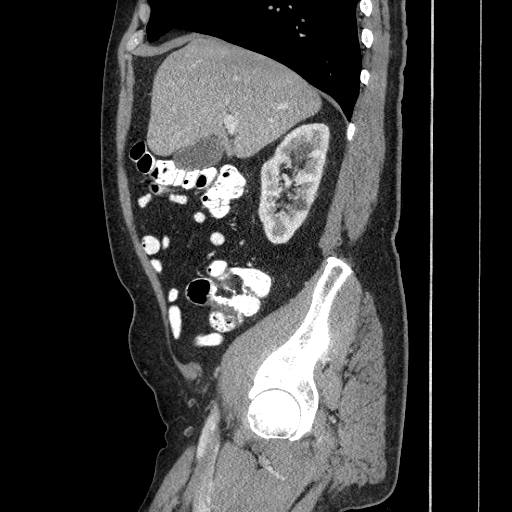
[im 74/170  soft-tissue]
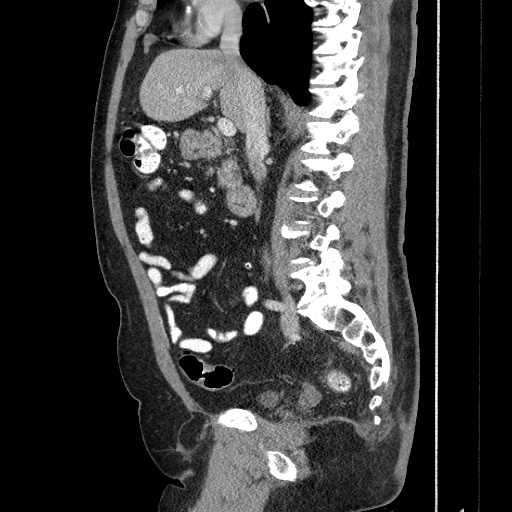

[12 of 36 positions shown; findings below may reference images not displayed]

FINDINGS: Lower chest:  No acute findings.

Hepatobiliary: No masses or other significant abnormality.
Gallbladder is unremarkable.

Pancreas: No mass, inflammatory changes, or other significant
abnormality.

Spleen: Within normal limits in size and appearance.

Adrenals/Urinary Tract: Normal adrenal glands. Small bilateral renal
calculi seen, largest in the midpole of the right kidney measuring 8
mm. No evidence of ureteral calculi or hydronephrosis. Sub-cm
low-attenuation lesion in the upper pole left kidney is too small to
characterize but most likely represents a renal cyst. 1 cm simple
renal cyst seen in posterior lower pole of right kidney. No definite
renal mass identified. Unopacified urinary bladder is unremarkable
in appearance.

Stomach/Bowel: Diverticulosis is seen involving the descending and
sigmoid portions of the colon, however there is no evidence of
diverticulitis. Normal appendix visualized.

Short segment annular wall thickening is seen involving the distal
sigmoid colon which measures approximately 5 cm in length. This is
suspicious for primary colon carcinoma. No evidence of bowel
obstruction.

Vascular/Lymphatic: No pathologically enlarged lymph nodes
identified. Shotty subcentimeter lymph nodes seen in gastrohepatic
ligament, largest measuring 8 mm on image 19 of series 3. No
evidence of abdominal aortic aneurysm. Aortic atherosclerotic
calcification noted.

Reproductive: No mass or other significant abnormality.

Other: None.

Musculoskeletal:  No suspicious bone lesions identified.
IMPRESSION: Short segment annular wall thickening involving distal sigmoid colon
over length of approximately 5 cm, suspicious for primary colon
carcinoma.

No evidence of bowel obstruction or metastatic disease within the
abdomen or pelvis.

Colonic diverticulosis. No radiographic evidence of diverticulitis.

Bilateral nonobstructive nephrolithiasis. No evidence of ureteral
calculi or hydronephrosis.

## 2018-06-08 NOTE — Patient Instructions (Addendum)
Jon Frost  06/08/2018   Your procedure is scheduled on: 06-22-2018    Report to [facility] Main  Entrance    Report to admitting at 5:30AM    Call this number if you have problems the morning of surgery [phone]      Remember: NO SOLID FOOD AFTER MIDNIGHT THE NIGHT PRIOR TO SURGERY. NOTHING BY MOUTH EXCEPT CLEAR LIQUIDS UNTIL 3 HOURS PRIOR TO [place] SURGERY. PLEASE FINISH ENSURE DRINK PER SURGEON ORDER 3 HOURS PRIOR TO SCHEDULED SURGERY TIME WHICH NEEDS TO BE COMPLETED AT ____4:30AM______.   BRUSH YOUR TEETH MORNING OF SURGERY AND RINSE YOUR MOUTH OUT, NO CHEWING GUM CANDY OR MINTS.       CLEAR LIQUID DIET   Foods Allowed                                                                     Foods Excluded  Coffee and tea, regular and decaf                             liquids that you cannot  Plain Jell-O in any flavor                                             see through such as: Fruit ices (not with fruit pulp)                                     milk, soups, orange juice  Iced Popsicles                                    All solid food Carbonated beverages, regular and diet                                    Cranberry, grape and apple juices Sports drinks like Gatorade Lightly seasoned clear broth or consume(fat free) Sugar, honey syrup  Sample Menu Breakfast                                Lunch                                     Supper Cranberry juice                    Beef broth                            Chicken broth Jell-O  Grape juice                           Apple juice Coffee or tea                        Jell-O                                      Popsicle                                                Coffee or tea                        Coffee or tea  _____________________________________________________________________       Take these medicines the morning of surgery with A SIP OF  WATER: colchicine if needed                               You may not have any metal on your body including hair pins and              piercings  Do not wear jewelry, make-up, lotions, powders or perfumes, deodorant                        Men may shave face and neck.   Do not bring valuables to the hospital. [place].  Contacts, dentures or bridgework may not be worn into surgery.      Patients discharged the day of surgery will not be allowed to drive home. IF YOU ARE HAVING SURGERY AND GOING HOME THE SAME DAY, YOU MUST HAVE AN ADULT TO DRIVE YOU HOME AND BE WITH YOU FOR 24 HOURS. YOU MAY GO HOME BY TAXI OR UBER OR ORTHERWISE, BUT AN ADULT MUST ACCOMPANY YOU HOME AND STAY WITH YOU FOR 24 HOURS.  Name and phone number of your driver:  Special Instructions: N/A              Please read over the following fact sheets you were given: _____________________________________________________________________             [facility] - Preparing for Surgery Before surgery, you can play an important role.  Because skin is not sterile, your skin needs to be as free of germs as possible.  You can reduce the number of germs on your skin by washing with CHG (chlorahexidine gluconate) soap before surgery.  CHG is an antiseptic cleaner which kills germs and bonds with the skin to continue killing germs even after washing. Please DO NOT use if you have an allergy to CHG or antibacterial soaps.  If your skin becomes reddened/irritated stop using the CHG and inform your nurse when you arrive at Short Stay. Do not shave (including legs and underarms) for at least 48 hours prior to the first CHG shower.  You may shave your face/neck. Please follow these instructions carefully:  1.  Shower with CHG Soap the night before surgery and the  morning  of Surgery.  2.  If you choose to wash your hair, wash your hair first as usual with your  normal  shampoo.  3.  After  you shampoo, rinse your hair and body thoroughly to remove the  shampoo.                           4.  Use CHG as you would any other liquid soap.  You can apply chg directly  to the skin and wash                       Gently with a scrungie or clean washcloth.  5.  Apply the CHG Soap to your body ONLY FROM THE NECK DOWN.   Do not use on face/ open                           Wound or open sores. Avoid contact with eyes, ears mouth and genitals (private parts).                       Wash face,  Genitals (private parts) with your normal soap.             6.  Wash thoroughly, paying special attention to the area where your surgery  will be performed.  7.  Thoroughly rinse your body with warm water from the neck down.  8.  DO NOT shower/wash with your normal soap after using and rinsing off  the CHG Soap.                9.  Pat yourself dry with a clean towel.            10.  Wear clean pajamas.            11.  Place clean sheets on your bed the night of your first shower and do not  sleep with pets. Day of Surgery : Do not apply any lotions/deodorants the morning of surgery.  Please wear clean clothes to the hospital/surgery center.  FAILURE TO FOLLOW THESE INSTRUCTIONS MAY RESULT IN THE CANCELLATION OF YOUR SURGERY PATIENT SIGNATURE_________________________________  NURSE SIGNATURE__________________________________  ________________________________________________________________________

## 2018-06-08 NOTE — Progress Notes (Signed)
ekg 10-27-17 epic   Stress test 10-28-2017 epci   lov cards Phylliss Bob, NP 10-27-17 epic

## 2018-06-09 ENCOUNTER — Encounter (HOSPITAL_COMMUNITY)
Admission: RE | Admit: 2018-06-09 | Discharge: 2018-06-09 | Disposition: A | Discharge status: Home / Self Care | Payer: BLUE CROSS/BLUE SHIELD | Source: Ambulatory Visit | Attending: Surgery | Admitting: Surgery

## 2018-06-09 ENCOUNTER — Encounter (HOSPITAL_COMMUNITY): Payer: Self-pay

## 2018-06-09 ENCOUNTER — Other Ambulatory Visit: Payer: Self-pay

## 2018-06-09 DIAGNOSIS — Z01812 Encounter for preprocedural laboratory examination: Secondary | ICD-10-CM | POA: Diagnosis not present

## 2018-06-09 HISTORY — DX: Tinnitus, unspecified ear: H93.19

## 2018-06-09 HISTORY — DX: Unilateral inguinal hernia, without obstruction or gangrene, not specified as recurrent: K40.90

## 2018-06-09 LAB — BASIC METABOLIC PANEL
Anion gap: 7 (ref 5–15)
BUN: 14 mg/dL (ref 6–20)
CO2: 27 mmol/L (ref 22–32)
Calcium: 9.7 mg/dL (ref 8.9–10.3)
Chloride: 106 mmol/L (ref 98–111)
Creatinine, Ser: 1.12 mg/dL (ref 0.61–1.24)
GFR calc Af Amer: >60 mL/min (ref >60)
GFR calc non Af Amer: >60 mL/min (ref >60)
Glucose, Bld: 106 mg/dL — ABNORMAL HIGH (ref 70–99)
Potassium: 4.6 mmol/L (ref 3.5–5.1)
Sodium: 140 mmol/L (ref 135–145)

## 2018-06-09 LAB — CBC
HCT: 44.6 % (ref 39.0–52.0)
Hemoglobin: 15 g/dL (ref 13.0–17.0)
MCH: 32.5 pg (ref 26.0–34.0)
MCHC: 33.6 g/dL (ref 30.0–36.0)
MCV: 96.5 fL (ref 80.0–100.0)
Platelets: 150 10*3/uL (ref 150–400)
RBC: 4.62 MIL/uL (ref 4.22–5.81)
RDW: 13.1 % (ref 11.5–15.5)
WBC: 5.1 10*3/uL (ref 4.0–10.5)
nRBC: 0 % (ref 0.0–0.2)

## 2018-06-21 MED ORDER — BUPIVACAINE LIPOSOME 1.3 % IJ SUSP
20.0000 mL | INTRAMUSCULAR | Status: DC
  Filled 2018-06-21: qty 20

## 2018-06-21 NOTE — Anesthesia Preprocedure Evaluation (Addendum)
Anesthesia Evaluation  Patient identified by MRN, date of birth, ID band Patient awake    Reviewed: Allergy & Precautions, NPO status , Patient's Chart, lab work & pertinent test results  History of Anesthesia Complications Negative for: history of anesthetic complications  Airway Mallampati: IV  TM Distance: >3 FB Neck ROM: Full    Dental  (+) Dental Advisory Given   Pulmonary former smoker (quit 1990),    breath sounds clear to auscultation       Cardiovascular hypertension, Pt. on medications + Peripheral Vascular Disease (4.1 cm ascending thoracic aortic aneurysm)   Rhythm:Regular Rate:Normal  6/19 stress test: normal with good exercise tolerance, normal BP response, no changes on EKG.   Neuro/Psych negative neurological ROS     GI/Hepatic Neg liver ROS, S/p rectal cancer   Endo/Other  obese  Renal/GU Renal InsufficiencyRenal disease     Musculoskeletal   Abdominal (+) + obese,   Peds  Hematology negative hematology ROS (+)   Anesthesia Other Findings   Reproductive/Obstetrics                            Anesthesia Physical Anesthesia Plan  ASA: III  Anesthesia Plan: General   Post-op Pain Management:    Induction: Intravenous  PONV Risk Score and Plan: 3 and Ondansetron, Dexamethasone and Scopolamine patch - Pre-op  Airway Management Planned: Oral ETT and Video Laryngoscope Planned  Additional Equipment:   Intra-op Plan:   Post-operative Plan: Extubation in OR  Informed Consent: I have reviewed the patients History and Physical, chart, labs and discussed the procedure including the risks, benefits and alternatives for the proposed anesthesia with the patient or authorized representative who has indicated his/her understanding and acceptance.     Dental advisory given  Plan Discussed with: CRNA and Surgeon  Anesthesia Plan Comments: (Plan routine monitors, GETA with  VideoGlide intubation, VSS)       Anesthesia Quick Evaluation

## 2018-06-22 ENCOUNTER — Ambulatory Visit (HOSPITAL_COMMUNITY): Payer: BLUE CROSS/BLUE SHIELD | Admitting: Anesthesiology

## 2018-06-22 ENCOUNTER — Ambulatory Visit (HOSPITAL_COMMUNITY)
Admission: RE | Admit: 2018-06-22 | Discharge: 2018-06-22 | Disposition: A | Discharge status: Home / Self Care | Payer: BLUE CROSS/BLUE SHIELD | Source: Other Acute Inpatient Hospital | Attending: Surgery | Admitting: Surgery

## 2018-06-22 ENCOUNTER — Other Ambulatory Visit: Payer: Self-pay

## 2018-06-22 ENCOUNTER — Encounter (HOSPITAL_COMMUNITY): Payer: Self-pay | Admitting: *Deleted

## 2018-06-22 ENCOUNTER — Encounter (HOSPITAL_COMMUNITY)
Admission: RE | Disposition: A | Discharge status: Home / Self Care | Payer: Self-pay | Source: Other Acute Inpatient Hospital | Attending: Surgery

## 2018-06-22 DIAGNOSIS — Z87891 Personal history of nicotine dependence: Secondary | ICD-10-CM | POA: Diagnosis not present

## 2018-06-22 DIAGNOSIS — Z79899 Other long term (current) drug therapy: Secondary | ICD-10-CM | POA: Diagnosis not present

## 2018-06-22 DIAGNOSIS — M109 Gout, unspecified: Secondary | ICD-10-CM | POA: Diagnosis not present

## 2018-06-22 DIAGNOSIS — I129 Hypertensive chronic kidney disease with stage 1 through stage 4 chronic kidney disease, or unspecified chronic kidney disease: Secondary | ICD-10-CM | POA: Insufficient documentation

## 2018-06-22 DIAGNOSIS — K4090 Unilateral inguinal hernia, without obstruction or gangrene, not specified as recurrent: Secondary | ICD-10-CM | POA: Diagnosis present

## 2018-06-22 DIAGNOSIS — I712 Thoracic aortic aneurysm, without rupture: Secondary | ICD-10-CM | POA: Diagnosis not present

## 2018-06-22 DIAGNOSIS — I739 Peripheral vascular disease, unspecified: Secondary | ICD-10-CM | POA: Diagnosis not present

## 2018-06-22 DIAGNOSIS — E785 Hyperlipidemia, unspecified: Secondary | ICD-10-CM | POA: Insufficient documentation

## 2018-06-22 DIAGNOSIS — K4020 Bilateral inguinal hernia, without obstruction or gangrene, not specified as recurrent: Secondary | ICD-10-CM | POA: Diagnosis not present

## 2018-06-22 DIAGNOSIS — N183 Chronic kidney disease, stage 3 (moderate): Secondary | ICD-10-CM | POA: Insufficient documentation

## 2018-06-22 DIAGNOSIS — Z9049 Acquired absence of other specified parts of digestive tract: Secondary | ICD-10-CM | POA: Insufficient documentation

## 2018-06-22 DIAGNOSIS — Z85048 Personal history of other malignant neoplasm of rectum, rectosigmoid junction, and anus: Secondary | ICD-10-CM | POA: Insufficient documentation

## 2018-06-22 HISTORY — PX: INGUINAL HERNIA REPAIR: SHX194

## 2018-06-22 SURGERY — REPAIR, HERNIA, INGUINAL, LAPAROSCOPIC
Anesthesia: General | Site: Abdomen

## 2018-06-22 MED ORDER — DEXAMETHASONE SODIUM PHOSPHATE 10 MG/ML IJ SOLN
INTRAMUSCULAR | Status: DC | PRN
  Administered 2018-06-22: 8 mg via INTRAVENOUS

## 2018-06-22 MED ORDER — GABAPENTIN 300 MG PO CAPS
300.0000 mg | ORAL_CAPSULE | ORAL | Status: AC
  Administered 2018-06-22: 300 mg via ORAL
  Filled 2018-06-22: qty 1

## 2018-06-22 MED ORDER — BUPIVACAINE HCL (PF) 0.25 % IJ SOLN
INTRAMUSCULAR | Status: AC
  Filled 2018-06-22: qty 60

## 2018-06-22 MED ORDER — SUGAMMADEX SODIUM 500 MG/5ML IV SOLN
INTRAVENOUS | Status: DC | PRN
  Administered 2018-06-22: 300 mg via INTRAVENOUS

## 2018-06-22 MED ORDER — PHENYLEPHRINE HCL 10 MG/ML IJ SOLN
INTRAMUSCULAR | Status: DC | PRN
  Administered 2018-06-22: 40 ug via INTRAVENOUS
  Administered 2018-06-22 (×2): 80 ug via INTRAVENOUS
  Administered 2018-06-22: 120 ug via INTRAVENOUS
  Administered 2018-06-22 (×2): 80 ug via INTRAVENOUS

## 2018-06-22 MED ORDER — SCOPOLAMINE 1 MG/3DAYS TD PT72
1.0000 | MEDICATED_PATCH | Freq: Once | TRANSDERMAL | Status: DC
  Administered 2018-06-22: 1.5 mg via TRANSDERMAL
  Filled 2018-06-22: qty 1

## 2018-06-22 MED ORDER — SUGAMMADEX SODIUM 500 MG/5ML IV SOLN
INTRAVENOUS | Status: AC
  Filled 2018-06-22: qty 5

## 2018-06-22 MED ORDER — LIDOCAINE 2% (20 MG/ML) 5 ML SYRINGE
INTRAMUSCULAR | Status: AC
  Filled 2018-06-22: qty 5

## 2018-06-22 MED ORDER — ONDANSETRON HCL 4 MG/2ML IJ SOLN
INTRAMUSCULAR | Status: AC
  Filled 2018-06-22: qty 2

## 2018-06-22 MED ORDER — HYDROMORPHONE HCL 1 MG/ML IJ SOLN
INTRAMUSCULAR | Status: AC
  Filled 2018-06-22: qty 2

## 2018-06-22 MED ORDER — MEPERIDINE HCL 50 MG/ML IJ SOLN: 6.2500 mg | INTRAMUSCULAR | Status: DC | PRN

## 2018-06-22 MED ORDER — CHLORHEXIDINE GLUCONATE CLOTH 2 % EX PADS: 6.0000 | MEDICATED_PAD | Freq: Once | CUTANEOUS | Status: DC

## 2018-06-22 MED ORDER — DEXAMETHASONE SODIUM PHOSPHATE 10 MG/ML IJ SOLN
INTRAMUSCULAR | Status: AC
  Filled 2018-06-22: qty 1

## 2018-06-22 MED ORDER — CEFAZOLIN SODIUM-DEXTROSE 2-4 GM/100ML-% IV SOLN
2.0000 g | INTRAVENOUS | Status: AC
  Administered 2018-06-22: 2 g via INTRAVENOUS
  Filled 2018-06-22: qty 100

## 2018-06-22 MED ORDER — PROPOFOL 10 MG/ML IV BOLUS
INTRAVENOUS | Status: AC
  Filled 2018-06-22: qty 20

## 2018-06-22 MED ORDER — MIDAZOLAM HCL 2 MG/2ML IJ SOLN: 0.5000 mg | Freq: Once | INTRAMUSCULAR | Status: DC | PRN

## 2018-06-22 MED ORDER — FENTANYL CITRATE (PF) 100 MCG/2ML IJ SOLN
INTRAMUSCULAR | Status: DC | PRN
  Administered 2018-06-22 (×2): 50 ug via INTRAVENOUS

## 2018-06-22 MED ORDER — PROMETHAZINE HCL 25 MG/ML IJ SOLN: 6.2500 mg | INTRAMUSCULAR | Status: DC | PRN

## 2018-06-22 MED ORDER — STERILE WATER FOR IRRIGATION IR SOLN
Status: DC | PRN
  Administered 2018-06-22: 1000 mL

## 2018-06-22 MED ORDER — LACTATED RINGERS IV SOLN
INTRAVENOUS | Status: DC
  Administered 2018-06-22: 06:00:00 via INTRAVENOUS

## 2018-06-22 MED ORDER — CELECOXIB 200 MG PO CAPS
200.0000 mg | ORAL_CAPSULE | ORAL | Status: AC
  Administered 2018-06-22: 200 mg via ORAL
  Filled 2018-06-22: qty 1

## 2018-06-22 MED ORDER — GABAPENTIN 300 MG PO CAPS: 300.0000 mg | ORAL_CAPSULE | Freq: Two times a day (BID) | ORAL | 1 refills | Status: DC

## 2018-06-22 MED ORDER — HYDROMORPHONE HCL 1 MG/ML IJ SOLN
0.2500 mg | INTRAMUSCULAR | Status: DC | PRN
  Administered 2018-06-22 (×2): 0.5 mg via INTRAVENOUS

## 2018-06-22 MED ORDER — ACETAMINOPHEN 500 MG PO TABS
1000.0000 mg | ORAL_TABLET | ORAL | Status: AC
  Administered 2018-06-22: 1000 mg via ORAL
  Filled 2018-06-22: qty 2

## 2018-06-22 MED ORDER — TRAMADOL HCL 50 MG PO TABS: 50.0000 mg | ORAL_TABLET | Freq: Four times a day (QID) | ORAL | 0 refills | Status: DC | PRN

## 2018-06-22 MED ORDER — MIDAZOLAM HCL 2 MG/2ML IJ SOLN
INTRAMUSCULAR | Status: AC
  Filled 2018-06-22: qty 2

## 2018-06-22 MED ORDER — LIDOCAINE HCL 2 % IJ SOLN
INTRAMUSCULAR | Status: AC
  Filled 2018-06-22: qty 20

## 2018-06-22 MED ORDER — PROPOFOL 10 MG/ML IV BOLUS
INTRAVENOUS | Status: DC | PRN
  Administered 2018-06-22: 150 mg via INTRAVENOUS

## 2018-06-22 MED ORDER — KETAMINE HCL 10 MG/ML IJ SOLN
INTRAMUSCULAR | Status: DC | PRN
  Administered 2018-06-22: 50 mg via INTRAVENOUS

## 2018-06-22 MED ORDER — MIDAZOLAM HCL 5 MG/5ML IJ SOLN
INTRAMUSCULAR | Status: DC | PRN
  Administered 2018-06-22: 2 mg via INTRAVENOUS

## 2018-06-22 MED ORDER — FENTANYL CITRATE (PF) 100 MCG/2ML IJ SOLN
INTRAMUSCULAR | Status: AC
  Filled 2018-06-22: qty 2

## 2018-06-22 MED ORDER — BUPIVACAINE LIPOSOME 1.3 % IJ SUSP
INTRAMUSCULAR | Status: DC | PRN
  Administered 2018-06-22: 20 mL

## 2018-06-22 MED ORDER — KETAMINE HCL 10 MG/ML IJ SOLN
INTRAMUSCULAR | Status: AC
  Filled 2018-06-22: qty 1

## 2018-06-22 MED ORDER — LIDOCAINE 2% (20 MG/ML) 5 ML SYRINGE
INTRAMUSCULAR | Status: DC | PRN
  Administered 2018-06-22: 100 mg via INTRAVENOUS

## 2018-06-22 MED ORDER — ONDANSETRON HCL 4 MG/2ML IJ SOLN
INTRAMUSCULAR | Status: DC | PRN
  Administered 2018-06-22: 4 mg via INTRAVENOUS

## 2018-06-22 MED ORDER — BUPIVACAINE HCL (PF) 0.25 % IJ SOLN
INTRAMUSCULAR | Status: DC | PRN
  Administered 2018-06-22: 60 mL

## 2018-06-22 MED ORDER — LIDOCAINE 20MG/ML (2%) 15 ML SYRINGE OPTIME
INTRAMUSCULAR | Status: DC | PRN
  Administered 2018-06-22: 1.5 mg/kg/h via INTRAVENOUS

## 2018-06-22 MED ORDER — 0.9 % SODIUM CHLORIDE (POUR BTL) OPTIME
TOPICAL | Status: DC | PRN
  Administered 2018-06-22: 1000 mL

## 2018-06-22 MED ORDER — ROCURONIUM BROMIDE 10 MG/ML (PF) SYRINGE
PREFILLED_SYRINGE | INTRAVENOUS | Status: DC | PRN
  Administered 2018-06-22: 80 mg via INTRAVENOUS

## 2018-06-22 SURGICAL SUPPLY — 41 items
CABLE HIGH FREQUENCY MONO STRZ (ELECTRODE) ×4 IMPLANT
CHLORAPREP W/TINT 26ML (MISCELLANEOUS) ×4 IMPLANT
COVER SURGICAL LIGHT HANDLE (MISCELLANEOUS) ×4 IMPLANT
COVER WAND RF STERILE (DRAPES) ×4 IMPLANT
DECANTER SPIKE VIAL GLASS SM (MISCELLANEOUS) ×4 IMPLANT
DEVICE SECURE STRAP 25 ABSORB (INSTRUMENTS) IMPLANT
DRAPE WARM FLUID 44X44 (DRAPE) ×4 IMPLANT
DRSG TEGADERM 2-3/8X2-3/4 SM (GAUZE/BANDAGES/DRESSINGS) ×8 IMPLANT
DRSG TEGADERM 4X4.75 (GAUZE/BANDAGES/DRESSINGS) ×4 IMPLANT
ELECT REM PT RETURN 15FT ADLT (MISCELLANEOUS) ×4 IMPLANT
GAUZE SPONGE 2X2 8PLY STRL LF (GAUZE/BANDAGES/DRESSINGS) ×2 IMPLANT
GLOVE BIOGEL PI IND STRL 7.0 (GLOVE) ×2 IMPLANT
GLOVE BIOGEL PI IND STRL 7.5 (GLOVE) ×4 IMPLANT
GLOVE BIOGEL PI INDICATOR 7.0 (GLOVE) ×2
GLOVE BIOGEL PI INDICATOR 7.5 (GLOVE) ×4
GLOVE ECLIPSE 6.5 STRL STRAW (GLOVE) ×4 IMPLANT
GLOVE ECLIPSE 8.0 STRL XLNG CF (GLOVE) ×4 IMPLANT
GLOVE INDICATOR 8.0 STRL GRN (GLOVE) ×4 IMPLANT
GOWN STRL REUS W/TWL XL LVL3 (GOWN DISPOSABLE) ×12 IMPLANT
IRRIG SUCT STRYKERFLOW 2 WTIP (MISCELLANEOUS)
IRRIGATION SUCT STRKRFLW 2 WTP (MISCELLANEOUS) IMPLANT
KIT BASIN OR (CUSTOM PROCEDURE TRAY) ×4 IMPLANT
MESH HERNIA 6X6 BARD (Mesh General) ×6 IMPLANT
MESH HERNIA BARD 6X6 (Mesh General) ×6 IMPLANT
NEEDLE INSUFFLATION 14GA 120MM (NEEDLE) IMPLANT
PAD POSITIONING PINK XL (MISCELLANEOUS) ×4 IMPLANT
SCISSORS LAP 5X35 DISP (ENDOMECHANICALS) ×4 IMPLANT
SET TUBE SMOKE EVAC HIGH FLOW (TUBING) ×4 IMPLANT
SLEEVE ADV FIXATION 5X100MM (TROCAR) ×4 IMPLANT
SPONGE GAUZE 2X2 STER 10/PKG (GAUZE/BANDAGES/DRESSINGS) ×2
SUT MNCRL AB 4-0 PS2 18 (SUTURE) ×4 IMPLANT
SUT PDS AB 1 CT1 27 (SUTURE) ×8 IMPLANT
SUT VIC AB 2-0 SH 27 (SUTURE) ×2
SUT VIC AB 2-0 SH 27X BRD (SUTURE) ×2 IMPLANT
SUT VICRYL 0 UR6 27IN ABS (SUTURE) ×4 IMPLANT
TACKER 5MM HERNIA 3.5CML NAB (ENDOMECHANICALS) IMPLANT
TOWEL OR 17X26 10 PK STRL BLUE (TOWEL DISPOSABLE) ×4 IMPLANT
TOWEL OR NON WOVEN STRL DISP B (DISPOSABLE) ×4 IMPLANT
TRAY LAPAROSCOPIC (CUSTOM PROCEDURE TRAY) ×4 IMPLANT
TROCAR ADV FIXATION 5X100MM (TROCAR) ×4 IMPLANT
TROCAR XCEL BLUNT TIP 100MML (ENDOMECHANICALS) ×4 IMPLANT

## 2018-06-22 NOTE — Discharge Instructions (Signed)
HERNIA REPAIR: POST OP INSTRUCTIONS ° °###################################################################### ° °EAT °Gradually transition to a high fiber diet with a fiber supplement over the next few weeks after discharge.  Start with a pureed / full liquid diet (see below) ° °WALK °Walk an hour a day.  Control your pain to do that.   ° °CONTROL PAIN °Control pain so that you can walk, sleep, tolerate sneezing/coughing, and go up/down stairs. ° °HAVE A BOWEL MOVEMENT DAILY °Keep your bowels regular to avoid problems.  OK to try a laxative to override constipation.  OK to use an antidairrheal to slow down diarrhea.  Call if not better after 2 tries ° °CALL IF YOU HAVE PROBLEMS/CONCERNS °Call if you are still struggling despite following these instructions. °Call if you have concerns not answered by these instructions ° °###################################################################### ° ° ° °1. DIET: Follow a light bland diet the first 24 hours after arrival home, such as soup, liquids, crackers, etc.  Be sure to include lots of fluids daily.  Advance to a low fat / high fiber diet over the next few days after surgery.  Avoid fast food or heavy meals the first week as your are more likely to get nauseated.   ° °2. Take your usually prescribed home medications unless otherwise directed. ° °3. PAIN CONTROL: °a. Pain is best controlled by a usual combination of three different methods TOGETHER: °i. Ice/Heat °ii. Over the counter pain medication °iii. Prescription pain medication °b. Most patients will experience some swelling and bruising around the hernia(s) such as the bellybutton, groins, or old incisions.  Ice packs or heating pads (30-60 minutes up to 6 times a day) will help. Use ice for the first few days to help decrease swelling and bruising, then switch to heat to help relax tight/sore spots and speed recovery.  Some people prefer to use ice alone, heat alone, alternating between ice & heat.  Experiment  to what works for you.  Swelling and bruising can take several weeks to resolve.   °c. It is helpful to take an over-the-counter pain medication regularly for the first few weeks.  Choose one of the following that works best for you: °i. Naproxen (Aleve, etc)  Two 220mg tabs twice a day °ii. Ibuprofen (Advil, etc) Three 200mg tabs four times a day (every meal & bedtime) °iii. Acetaminophen (Tylenol, etc) 325-650mg four times a day (every meal & bedtime) °d. A  prescription for pain medication should be given to you upon discharge.  Take your pain medication as prescribed.  °i. If you are having problems/concerns with the prescription medicine (does not control pain, nausea, vomiting, rash, itching, etc), please call us [phone] to see if we need to switch you to a different pain medicine that will work better for you and/or control your side effect better. °ii. If you need a refill on your pain medication, please contact your pharmacy.  They will contact our office to request authorization. Prescriptions will not be filled after 5 pm or on week-ends. ° °4. Avoid getting constipated.  Between the surgery and the pain medications, it is common to experience some constipation.  Increasing fluid intake and taking a fiber supplement (such as Metamucil, Citrucel, FiberCon, MiraLax, etc) 1-2 times a day regularly will usually help prevent this problem from occurring.  A mild laxative (prune juice, Milk of Magnesia, MiraLax, etc) should be taken according to package directions if there are no bowel movements after 48 hours.   ° °5. Wash / shower every   day.  You may shower over the dressings as they are waterproof.    6. Remove your waterproof bandages, skin tapes, and other bandages 5 days after surgery. You may replace a dressing/Band-Aid to cover the incision for comfort if you wish. You may leave the incisions open to air.  You may replace a dressing/Band-Aid to cover an incision for comfort if you wish.   Continue to shower over incision(s) after the dressing is off.  7. ACTIVITIES as tolerated:   a. You may resume regular (light) daily activities beginning the next day--such as daily self-care, walking, climbing stairs--gradually increasing activities as tolerated.  Control your pain so that you can walk an hour a day.  If you can walk 30 minutes without difficulty, it is safe to try more intense activity such as jogging, treadmill, bicycling, low-impact aerobics, swimming, etc. b. Save the most intensive and strenuous activity for last such as sit-ups, heavy lifting, contact sports, etc  Refrain from any heavy lifting or straining until you are off narcotics for pain control.   c. DO NOT PUSH THROUGH PAIN.  Let pain be your guide: If it hurts to do something, don't do it.  Pain is your body warning you to avoid that activity for another week until the pain goes down. d. You may drive when you are no longer taking prescription pain medication, you can comfortably wear a seatbelt, and you can safely maneuver your car and apply brakes. e. Dennis Bast may have sexual intercourse when it is comfortable.   8. FOLLOW UP in our office a. Please call [facility] at [phone] to set up an appointment to see your surgeon in the office for a follow-up appointment approximately 2-3 weeks after your surgery. b. Make sure that you call for this appointment the day you arrive home to insure a convenient appointment time.  9.  If you have disability of FMLA / Family leave forms, please bring the forms to the office for processing.  (do not give to your surgeon).  WHEN TO CALL us [phone]: 1. Poor pain control 2. Reactions / problems with new medications (rash/itching, nausea, etc)  3. Fever over 101.5 F (38.5 C) 4. Inability to urinate 5. Nausea and/or vomiting 6. Worsening swelling or bruising 7. Continued bleeding from incision. 8. Increased pain, redness, or drainage from the incision   The clinic staff is  available to answer your questions during regular business hours (8:30am-5pm).  Please dont hesitate to call and ask to speak to one of our nurses for clinical concerns.   If you have a medical emergency, go to the nearest emergency room or call 911.  A surgeon from [facility] is always on call at the hospitals in [facility], [place], [place], [place], [place]  [address] ?  [address] [address], [place], [place]   [address] MAIN: [phone] ? TOLL FREE: [phone] ? FAX: [phone] [email or web address]    Inguinal Hernia, Adult An inguinal hernia develops when fat or the intestines push through a weak spot in a muscle where your leg meets your lower abdomen (groin). This creates a bulge. This kind of hernia could also be:  In your scrotum, if you are male.  In folds of skin around your vagina, if you are male. There are three types of inguinal hernias:  Hernias that can be pushed back into the abdomen (are reducible). This type rarely causes pain.  Hernias that are not reducible (  are incarcerated).  Hernias that are not reducible and lose their blood supply (are strangulated). This type of hernia requires emergency surgery. What are the causes? This condition is caused by having a weak spot in the muscles or tissues in the groin. This weak spot develops over time. The hernia may poke through the weak spot when you suddenly strain your lower abdominal muscles, such as when you:  Lift a heavy object.  Strain to have a bowel movement. Constipation can lead to straining.  Cough. What increases the risk? This condition is more likely to develop in:  Men.  Pregnant women.  People who: ? Are overweight. ? Work in jobs that require long periods of standing or heavy lifting. ? Have had an inguinal hernia before. ? Smoke or have lung disease. These factors can lead to long-lasting (chronic) coughing. What are the  signs or symptoms? Symptoms may depend on the size of the hernia. Often, a small inguinal hernia has no symptoms. Symptoms of a larger hernia may include:  A lump in the groin area. This is easier to see when standing. It might not be visible when lying down.  Pain or burning in the groin. This may get worse when lifting, straining, or coughing.  A dull ache or a feeling of pressure in the groin.  In men, an unusual lump in the scrotum. Symptoms of a strangulated inguinal hernia may include:  A bulge in your groin that is very painful and tender to the touch.  A bulge that turns red or purple.  Fever, nausea, and vomiting.  Inability to have a bowel movement or to pass gas. How is this diagnosed? This condition is diagnosed based on your symptoms, your medical history, and a physical exam. Your health care provider may feel your groin area and ask you to cough. How is this treated? Treatment depends on the size of your hernia and whether you have symptoms. If you do not have symptoms, your health care provider may have you watch your hernia carefully and have you come in for follow-up visits. If your hernia is large or if you have symptoms, you may need surgery to repair the hernia. Follow these instructions at home: Lifestyle  Avoid lifting heavy objects.  Avoid standing for long periods of time.  Do not use any products that contain nicotine or tobacco, such as cigarettes and e-cigarettes. If you need help quitting, ask your health care provider.  Maintain a healthy weight. Preventing constipation  Take actions to prevent constipation. Constipation leads to straining with bowel movements, which can make a hernia worse or cause a hernia repair to break down. Your health care provider may recommend that you: ? Drink enough fluid to keep your urine pale yellow. ? Eat foods that are high in fiber, such as fresh fruits and vegetables, whole grains, and beans. ? Limit foods that are  high in fat and processed sugars, such as fried or sweet foods. ? Take an over-the-counter or prescription medicine for constipation. General instructions  You may try to push the hernia back in place by very gently pressing on it while lying down. Do not try to force the bulge back in if it will not push in easily.  Watch your hernia for any changes in shape, size, or color. Get help right away if you notice any changes.  Take over-the-counter and prescription medicines only as told by your health care provider.  Keep all follow-up visits as told by your health  care provider. This is important. Contact a health care provider if:  You have a fever.  You develop new symptoms.  Your symptoms get worse. Get help right away if:  You have pain in your groin that suddenly gets worse.  You have a bulge in your groin that: ? Suddenly gets bigger and does not get smaller. ? Becomes red or purple or painful to the touch.  You are a man and you have a sudden pain in your scrotum, or the size of your scrotum suddenly changes.  You cannot push the hernia back in place by very gently pressing on it when you are lying down. Do not try to force the bulge back in if it will not push in easily.  You have nausea or vomiting that does not go away.  You have a fast heartbeat.  You cannot have a bowel movement or pass gas. These symptoms may represent a serious problem that is an emergency. Do not wait to see if the symptoms will go away. Get medical help right away. Call your local emergency services (911 in the [place] Summary  An inguinal hernia develops when fat or the intestines push through a weak spot in a muscle where your leg meets your lower abdomen (groin).  This condition is caused by having a weak spot in muscles or tissue in your groin.  Symptoms may depend on the size of the hernia, and they may include pain or swelling in your groin. A small inguinal hernia often has no  symptoms.  Treatment may not be needed if you do not have symptoms. If you have symptoms or a large hernia, you may need surgery to repair the hernia.  Avoid lifting heavy objects. Also avoid standing for long amounts of time. This information is not intended to replace advice given to you by your health care provider. Make sure you discuss any questions you have with your health care provider. Document Released: 09/12/2008 Document Revised: 05/28/2017 Document Reviewed: 01/26/2017 Elsevier Interactive Patient Education  2019 [employer].

## 2018-06-22 NOTE — H&P (Signed)
Jon Frost DOB: 1960/12/30 Married / Language: English / Race: White Male  Patient Care Team: Marton Redwood, MD as PCP - General (Internal Medicine) End, Harrell Gave, MD as PCP - Cardiology (Cardiology) Michael Boston, MD as Consulting Physician (General Surgery) Clarene Essex, MD as Consulting Physician (Gastroenterology) Ladell Pier, MD as Consulting Physician (Oncology)  ` Patient sent for surgical consultation at the request of Dr Julieanne Manson  Chief Complaint: New right inguinal hernia. ` ` The patient is a pleasant gentleman. I resected a rectal cancer in 2017. Robotically assisted with extraction wound and right paramedian region T3 N0. He's had follow-up colonoscopy with benign polyps removed. No evidence of cancer recurrence. He was incidentally noted to have a right inguinal hernia on his follow-up visit with his medical oncologist. Surgical consultation requested.  Patient comes today with his wife. He is noticed some increasing discomfort and sensitivity. No severe sharp pains but has gotten much larger in the past 3 months. Usually moves his bowels a couple times a day. Sometimes more often. Trying to stand Metamucil which helps regulate things. No problems with urination erection or ejaculation. He remains physically active. Does not smoke. He has regained his 20 pounds of weight. BMI 32. No nausea or vomiting.  (Review of systems as stated in this history (HPI) or in the review of systems. Otherwise all other 12 point ROS are negative) ` ` `   Allergies (Jacqueline Haggett, RMA; 04/11/2018 11:08 AM) No Known Drug Allergies [10/09/2015]: Allergies Reconciled  Medication History [employer], RMA; 04/11/2018 11:08 AM) Valsartan-hydroCHLOROthiazide (80-12.5MG  Tablet, Oral) Active. Fish Oil (1360MG  Capsule, Oral) Active. Multi Vitamin (Oral) Active. Rosuvastatin Calcium (10MG  Tablet, Oral) Active. Tamsulosin HCl (0.4MG   Capsule, Oral) Active. Medications Reconciled    Vitals (Jacqueline Haggett RMA; 04/11/2018 11:09 AM) 04/11/2018 11:08 AM Weight: 229 lb Height: 71in Body Surface Area: 2.23 m Body Mass Index: 31.94 kg/m  Temp.: 97.71F(Temporal)  Pulse: 67 (Regular)  P.OX: 96% (Room air) BP: 126/80 (Sitting, Right Arm, Standard)  BP 137/86   Pulse 68   Temp 97.8 F (36.6 C) (Oral)   Resp 18   Ht 5\' 10"  (1.778 m)   Wt 103 kg   SpO2 99%   BMI 32.57 kg/m      Physical Exam [name] MD; 04/11/2018 11:30 AM)  General Mental Status-Alert. General Appearance-Not in acute distress, Not Sickly. Orientation-Oriented X3. Hydration-Well hydrated. Voice-Normal.  Integumentary Global Assessment Upon inspection and palpation of skin surfaces of the - Axillae: non-tender, no inflammation or ulceration, no drainage. and Distribution of scalp and body hair is normal. General Characteristics Temperature - normal warmth is noted.  Head and Neck Head-normocephalic, atraumatic with no lesions or palpable masses. Face Global Assessment - atraumatic, no absence of expression. Neck Global Assessment - no abnormal movements, no bruit auscultated on the right, no bruit auscultated on the left, no decreased range of motion, non-tender. Trachea-midline. Thyroid Gland Characteristics - non-tender.  Eye Eyeball - Left-Extraocular movements intact, No Nystagmus. Eyeball - Right-Extraocular movements intact, No Nystagmus. Cornea - Left-No Hazy. Cornea - Right-No Hazy. Sclera/Conjunctiva - Left-No scleral icterus, No Discharge. Sclera/Conjunctiva - Right-No scleral icterus, No Discharge. Pupil - Left-Direct reaction to light normal. Pupil - Right-Direct reaction to light normal.  ENMT Ears Pinna - Left - no drainage observed, no generalized tenderness observed. Right - no drainage observed, no generalized tenderness observed. Nose and  Sinuses External Inspection of the Nose - no destructive lesion observed. Inspection of the nares - Left -  quiet respiration. Right - quiet respiration. Mouth and Throat Lips - Upper Lip - no fissures observed, no pallor noted. Lower Lip - no fissures observed, no pallor noted. Nasopharynx - no discharge present. Oral Cavity/Oropharynx - Tongue - no dryness observed. Oral Mucosa - no cyanosis observed. Hypopharynx - no evidence of airway distress observed.  Chest and Lung Exam Inspection Movements - Normal and Symmetrical. Accessory muscles - No use of accessory muscles in breathing. Palpation Palpation of the chest reveals - Non-tender. Auscultation Breath sounds - Normal and Clear.  Cardiovascular Auscultation Rhythm - Regular. Murmurs & Other Heart Sounds - Auscultation of the heart reveals - No Murmurs and No Systolic Clicks.  Abdomen Inspection Inspection of the abdomen reveals - No Visible peristalsis and No Abnormal pulsations. Umbilicus - No Bleeding, No Urine drainage. Palpation/Percussion Palpation and Percussion of the abdomen reveal - Soft, Non Tender, No Rebound tenderness, No Rigidity (guarding) and No Cutaneous hyperesthesia. Note: Obese but soft. Moderate suprapubic umbilical diastases recti. Right paramedian and other incisions well-healed. No umbilical hernia. Nontender nondistended.  Male Genitourinary Sexual Maturity Tanner 5 - Adult hair pattern and Adult penile size and shape. Note: Obvious right groin swelling going partially down the scrotum consistent with large right inguinal hernia.  No definite left-sided hernia. Otherwise normal external genitalia. No inguinal hernias. No lymphadenopathy.  Peripheral Vascular Upper Extremity Inspection - Left - No Cyanotic nailbeds, Not Ischemic. Right - No Cyanotic nailbeds, Not Ischemic.  Neurologic Neurologic evaluation reveals -normal attention span and ability to concentrate, able to name objects  and repeat phrases. Appropriate fund of knowledge , normal sensation and normal coordination. Mental Status Affect - not angry, not paranoid. Cranial Nerves-Normal Bilaterally. Gait-Normal.  Neuropsychiatric Mental status exam performed with findings of-able to articulate well with normal speech/language, rate, volume and coherence, thought content normal with ability to perform basic computations and apply abstract reasoning and no evidence of hallucinations, delusions, obsessions or homicidal/suicidal ideation. Note: Quiet. Somewhat grim face.  Musculoskeletal Global Assessment Spine, Ribs and Pelvis - no instability, subluxation or laxity. Right Upper Extremity - no instability, subluxation or laxity.  Lymphatic Head & Neck  General Head & Neck Lymphatics: Bilateral - Description - No Localized lymphadenopathy. Axillary  General Axillary Region: Bilateral - Description - No Localized lymphadenopathy. Femoral & Inguinal  Generalized Femoral & Inguinal Lymphatics: Left - Description - No Localized lymphadenopathy. Right - Description - No Localized lymphadenopathy.    Assessment & Plan [name] MD; 04/11/2018 11:33 AM)  Carolynn Serve HERNIA (K40.90) Impression: Rather large right inguinal hernia going down to the scrotum. Sensitive but mostly reducible.  I think he would benefit from surgical repair. Reasonable start laparoscopically. Allow me to look on the contralateral left side as well. Often have to use extra sheets of mesh given his obesity and large hernia size.  The anatomy & physiology of the abdominal wall and pelvic floor was discussed. The pathophysiology of hernias in the inguinal and pelvic region was discussed. Natural history risks such as progressive enlargement, pain, incarceration, and strangulation was discussed. Contributors to complications such as smoking, obesity, diabetes, prior surgery, etc were discussed.  I feel the risks of  no intervention will lead to serious problems that outweigh the operative risks; therefore, I recommended surgery to reduce and repair the hernia. I explained laparoscopic techniques with possible need for an open approach. I noted usual use of mesh to patch and/or buttress hernia repair  Risks such as bleeding, infection, abscess, need for further treatment, heart  attack, death, and other risks were discussed. I noted a good likelihood this will help address the problem. Goals of post-operative recovery were discussed as well. Possibility that this will not correct all symptoms was explained. I stressed the importance of low-impact activity, aggressive pain control, avoiding constipation, & not pushing through pain to minimize risk of post-operative chronic pain or injury. Possibility of reherniation was discussed. We will work to minimize complications.    HISTORY OF RECTAL CANCER (Z85.048) Impression: No evidence of recurrence.  Follow-up endoscopy per his gastroenterologist and medical oncologist.  [name], MD, FACS, MASCRS Gastrointestinal and Minimally Invasive Surgery    [address] [place], [place] [address] [phone] Main / Paging [phone] Fax

## 2018-06-22 NOTE — Progress Notes (Signed)
Lt nare nasal trumpet placed per CRNA upon arrival to pacu

## 2018-06-22 NOTE — Anesthesia Procedure Notes (Signed)
Procedure Name: Intubation Date/Time: 06/22/2018 7:59 AM Performed by: Clyde Lundborg, RN Pre-anesthesia Checklist: Patient identified Patient Re-evaluated:Patient Re-evaluated prior to induction Oxygen Delivery Method: Circle system utilized Preoxygenation: Pre-oxygenation with 100% oxygen Induction Type: IV induction Ventilation: Oral airway inserted - appropriate to patient size and Mask ventilation without difficulty Laryngoscope Size: Mac and 4 Grade View: Grade III Tube type: Oral Tube size: 7.5 mm Number of attempts: 1 Airway Equipment and Method: Stylet Placement Confirmation: breath sounds checked- equal and bilateral and positive ETCO2 (able to see arytenoids) Secured at: 22 cm Tube secured with: Tape Dental Injury: Teeth and Oropharynx as per pre-operative assessment

## 2018-06-22 NOTE — Anesthesia Postprocedure Evaluation (Signed)
Anesthesia Post Note  Patient: Jon Frost  Procedure(s) Performed: LAPAROSCOPIC BILATERAL INGUINAL HERNIA REPAIR WITH INSERTION OF MESH, TAP BLOCK,  ERAS PATHWAY (N/A Abdomen)     Patient location during evaluation: PACU Anesthesia Type: General Level of consciousness: awake and alert, patient cooperative and oriented Pain management: pain level controlled Vital Signs Assessment: post-procedure vital signs reviewed and stable Respiratory status: spontaneous breathing, nonlabored ventilation and respiratory function stable Cardiovascular status: blood pressure returned to baseline and stable Postop Assessment: no apparent nausea or vomiting Anesthetic complications: no    Last Vitals:  Vitals:   06/22/18 1116 06/22/18 1152  BP: 107/79 (!) 123/92  Pulse: 60 (!) 103  Resp: 14 16  Temp: 36.5 C 36.7 C  SpO2: 97% 98%    Last Pain:  Vitals:   06/22/18 1152  TempSrc:   PainSc: 2                  Jamelah Sitzer,E. Roger Kettles

## 2018-06-22 NOTE — Op Note (Addendum)
06/22/2018  9:36 AM  PATIENT:  Jon Frost  58 y.o. male  Patient Care Team: Marton Redwood, MD as PCP - General (Internal Medicine) End, Harrell Gave, MD as PCP - Cardiology (Cardiology) Michael Boston, MD as Consulting Physician (General Surgery) Clarene Essex, MD as Consulting Physician (Gastroenterology) Ladell Pier, MD as Consulting Physician (Oncology)  PRE-OPERATIVE DIAGNOSIS:  right and possible left inguinal hernias  POST-OPERATIVE DIAGNOSIS:  Bilateral inguinal hernias  PROCEDURE:   LAPAROSCOPIC BILATERAL INGUINAL HERNIA REPAIR WITH INSERTION OF MESH BILATERAL TAP BLOCK  SURGEON:  [name], MD  ASSISTANT: None  ANESTHESIA:     Regional ilioinguinal and genitofemoral and spermatic cord nerve blocks  General  Nerve block provided with liposomal bupivacaine (Experel) mixed with 0.25% bupivacaine as a Bilateral TAP block x30 mL at the level of the transverse abdominis & preperitoneal spaces along the flank at the anterior axillary line, from subcostal ridge to iliac crest under laparoscopic guidance    EBL:  No intake/output data recorded..  See anesthesia record  Delay start of Pharmacological VTE agent (>24hrs) due to surgical blood loss or risk of bleeding:  no  DRAINS: NONE  SPECIMEN: Right inguinal hernia/scrotal sac as well as spermatic cord lipomas.  DISPOSITION OF SPECIMEN:  N/A  COUNTS:  YES  PLAN OF CARE: Discharge to home after PACU  PATIENT DISPOSITION:  PACU - hemodynamically stable.  INDICATION: Pleasant obese male with increasing right groin swelling and examination consistent with inguinal hernia going down to scrotum.  Probable left inguinal hernia as well on more recent exam.  I recommended laparoscopic exploration and repair of hernias found.  The anatomy & physiology of the abdominal wall and pelvic floor was discussed.  The pathophysiology of hernias in the inguinal and pelvic region was discussed.  Natural history risks such as  progressive enlargement, pain, incarceration & strangulation was discussed.   Contributors to complications such as smoking, obesity, diabetes, prior surgery, etc were discussed.    I feel the risks of no intervention will lead to serious problems that outweigh the operative risks; therefore, I recommended surgery to reduce and repair the hernia.  I explained laparoscopic techniques with possible need for an open approach.  I noted usual use of mesh to patch and/or buttress hernia repair  Risks such as bleeding, infection, abscess, need for further treatment, heart attack, death, and other risks were discussed.  I noted a good likelihood this will help address the problem.   Goals of post-operative recovery were discussed as well.  Possibility that this will not correct all symptoms was explained.  I stressed the importance of low-impact activity, aggressive pain control, avoiding constipation, & not pushing through pain to minimize risk of post-operative chronic pain or injury. Possibility of reherniation was discussed.  We will work to minimize complications.     An educational handout further explaining the pathology & treatment options was given as well.  Questions were answered.  The patient expresses understanding & wishes to proceed with surgery.  OR FINDINGS: Right greater than left indirect inguinal hernias.  Some direct space laxity on both sides suspicious for early direct space inguinal hernias as well.  No femoral nor obturator hernias.  DESCRIPTION:  The patient was identified & brought into the operating room. The patient was positioned supine with arms tucked. SCDs were active during the entire [place]. The patient underwent general anesthesia without any difficulty.  The abdomen was prepped and draped in a sterile fashion. The patient's bladder was emptied.  A Surgical Timeout confirmed our plan.  I made a transverse incision through the inferior umbilical fold.  I made a small transverse  nick through the anterior rectus fascia contralateral to the inguinal hernia side and placed a 0-vicryl stitch through the fascia.  I placed a Hasson trocar into the preperitoneal plane.  Entry was clean.  We induced carbon dioxide insufflation. Camera inspection revealed no injury.  I used a 27mm angled scope to bluntly free the peritoneum off the infraumbilical anterior abdominal wall.  I created enough of a preperitoneal pocket to place 51mm ports into the right & left mid-abdomen into this preperitoneal cavity.  I focused attention on the RIGHT pelvis since that was the dominant hernia side.   I used blunt & focused sharp dissection to free the peritoneum off the flank and down to the pubic rim.  I freed the anteriolateral bladder wall off the anteriolateral pelvic wall, sparing midline attachments.   I located a swath of peritoneum going into a hernia fascial defect at the  internal ring consistent with  an indirect inguinal hernia..  I gradually freed the peritoneal hernia sac off safely and reduced it into the preperitoneal space.  I freed the peritoneum off the spermatic vessels & vas deferens.  I freed peritoneum off the retroperitoneum along the psoas muscle.  Spermatic cord lipoma was dissected away & removed.  I checked & assured hemostasis.  He had a rather large hernia sac.  I excised most of redundancy and closed that peritoneal defect using 2-0 Vicryl suture using laparoscopic intracorporeal suturing.  Some laxity at the direct space suspicious for an early direct space inguinal hernia.  I turned attention on the opposite  LEFT pelvis.  I did dissection in a similar, mirror-image fashion. The patient had an indirect inguinal hernia..   Smaller but definite.  Spermatic cord lipoma was dissected away & removed.    I checked & assured hemostasis.   Small but definite direct space hernia on the left side as well.  No evidence of any femoral or obturator hernias.  Does have numerous hernias in the  right side being rather large and this obese man, I decided to use more standard weight Bard 15x15 cm sheets polypropylene mesh, one for each side.  I cut a single sigmoid-shaped slit ~6cm from a corner of each mesh.  I placed the meshes into the preperitoneal space & laid them as overlapping diamonds such that at the inferior points, a 6x6 cm corner flap rested in the true anterolateral pelvis, covering the obturator & femoral foramina.   I allowed the bladder to return to the pubis, this helping tuck the corners of the mesh in the anteriolateral pelvis.  The medial corners overlapped each other across midline cephalad to the pubic rim.   Given the numerous hernias of moderate size, I placed a third 15x15cm mesh in the center as a vertical diamond.  The lateral wings of the mesh overlap across the direct spaces and internal rings where the dominant hernias were.  This provided good coverage and reinforcement of the hernia repairs.  Because of the central mesh placement with good overlap, I did not place any tacks.   I held the hernia sacs cephalad & evacuated carbon dioxide.  I closed the fascia with absorbable suture.  I closed the skin using 4-0 monocryl stitch.  Sterile dressings were applied.   The patient was extubated & arrived in the PACU in stable condition..  I had discussed postoperative  care with the patient in the holding area.  Instructions are written in the chart.  I discussed operative findings, updated the patient's status, discussed probable steps to recovery, and gave postoperative recommendations to the patient's spouse.  Recommendations were made.  Questions were answered.  She expressed understanding & appreciation.   [name], M.D., F.A.C.S. Gastrointestinal and Minimally Invasive Surgery [facility] [address] [place], [place] [address] [phone] Main / Paging  06/22/2018 9:36 AM

## 2018-06-22 NOTE — Transfer of Care (Signed)
Immediate Anesthesia Transfer of Care Note  Patient: Jon Frost  Procedure(s) Performed: LAPAROSCOPIC BILATERAL INGUINAL HERNIA REPAIR WITH INSERTION OF MESH, TAP BLOCK,  ERAS PATHWAY (N/A Abdomen)  Patient Location: PACU  Anesthesia Type:General  Level of Consciousness: sedated  Airway & Oxygen Therapy: Patient Spontanous Breathing and Patient connected to face mask oxygen  Post-op Assessment: Report given to RN and Post -op Vital signs reviewed and stable  Post vital signs: Reviewed and stable  Last Vitals:  Vitals Value Taken Time  BP 151/102 06/22/2018  9:48 AM  Temp    Pulse 62 06/22/2018  9:50 AM  Resp 16 06/22/2018  9:50 AM  SpO2 96 % 06/22/2018  9:50 AM  Vitals shown include unvalidated device data.  Last Pain:  Vitals:   06/22/18 0543  TempSrc: Oral         Complications: No apparent anesthesia complications

## 2018-06-22 NOTE — Interval H&P Note (Signed)
History and Physical Interval Note:  06/22/2018 7:33 AM  Jon Frost  has presented today for surgery, with the diagnosis of right and possible left inguinal hernias  The various methods of treatment have been discussed with the patient and family. After consideration of risks, benefits and other options for treatment, the patient has consented to  Procedure(s): LAPAROSCOPIC RIGHT AND POSSIBLE LEFT INGUINAL [place] (N/A) INSERTION OF MESH (N/A) as a surgical intervention .  The patient's history has been reviewed, patient examined, no change in status, stable for surgery.  I have reviewed the patient's chart and labs.  Questions were answered to the patient's satisfaction.    I have re-reviewed the the patient's records, history, medications, and allergies.  I have re-examined the patient.  I again discussed intraoperative plans and goals of post-operative recovery.  The patient agrees to proceed.  Jon Frost  1961/03/20 [id number]  Patient Care Team: Marton Redwood, MD as PCP - General (Internal Medicine) End, Harrell Gave, MD as PCP - Cardiology (Cardiology) Michael Boston, MD as Consulting Physician (General Surgery) Clarene Essex, MD as Consulting Physician (Gastroenterology) Ladell Pier, MD as Consulting Physician (Oncology)  Patient Active Problem List   Diagnosis Date Noted  . Metabolic syndrome 38/93/7342  . Agatston coronary artery calcium score greater than 400 10/27/2017  . Thoracic aortic aneurysm without rupture ([place]) 10/27/2017  . CKD (chronic kidney disease) stage 3, GFR 30-59 ml/min ([facility]) 10/27/2017  . Dyslipidemia, goal LDL below 70 10/27/2017  . Snoring 10/27/2017  . Rectal cancer s/p robotic low anterior rectosigmoid resection 11/13/2015 11/13/2015  . Obesity 11/13/2015  . Hypertension     Past Medical History:  Diagnosis Date  . CKD (chronic kidney disease), stage III ([place])   . Gout   . History of kidney stones 2013  . Hypertension    . Hypertriglyceridemia   . Impaired fasting glucose   . Inguinal hernia   . Metabolic syndrome   . Obesity 11/13/2015  . Rectal cancer s/p robotic low anterior rectosigmoid resection 11/13/2015 11/13/2015  . Recurrent nephrolithiasis   . Snoring   . Tinnitus     Past Surgical History:  Procedure Laterality Date  . XI ROBOTIC ASSISTED LOWER ANTERIOR RESECTION  2017   Proximal rectal cancer.  [name]    Social History   Socioeconomic History  . Marital status: Married    Spouse name: [name]  . Number of children: 0  . Years of education: Not on file  . Highest education level: Not on file  Occupational History  . Not on file  Social Needs  . Financial resource strain: Not on file  . Food insecurity:    Worry: Not on file    Inability: Not on file  . Transportation needs:    Medical: Not on file    Non-medical: Not on file  Tobacco Use  . Smoking status: Former Smoker    Packs/day: 0.50    Years: 15.00    Pack years: 7.50    Types: Cigarettes    Last attempt to quit: 11/09/1988    Years since quitting: 29.6  . Smokeless tobacco: Never Used  Substance and Sexual Activity  . Alcohol use: Yes    Alcohol/week: 0.0 standard drinks    Comment: glass wine SELDOM   . Drug use: No  . Sexual activity: Not on file    Comment: JAN  Lifestyle  . Physical activity:    Days per week: Not on file  Minutes per session: Not on file  . Stress: Not on file  Relationships  . Social connections:    Talks on phone: Not on file    Gets together: Not on file    Attends religious service: Not on file    Active member of club or organization: Not on file    Attends meetings of clubs or organizations: Not on file    Relationship status: Not on file  . Intimate partner violence:    Fear of current or ex partner: Not on file    Emotionally abused: Not on file    Physically abused: Not on file    Forced sexual activity: Not on file  Other Topics Concern  . 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 Diabetes Mother   . AAA (abdominal aortic aneurysm) Mother   . Diabetes Father   . CAD Father        CABG AGE 62  . Hypertension Father   . Diabetes Brother   . CVA Maternal Grandfather        in his 35's  . Cancer - Colon Paternal Grandfather     Medications Prior to Admission  Medication Sig Dispense Refill Last Dose  . colchicine 0.6 MG tablet Take 0.6 mg by mouth daily as needed (Gout).    Taking  . Omega-3 Fatty Acids (FISH OIL PO) Take 1 capsule by mouth daily.    Past Week at Unknown time  . psyllium (METAMUCIL SMOOTH TEXTURE) 28 % packet Take 1 packet by mouth daily.   Past Week at Unknown time  . rosuvastatin (CRESTOR) 10 MG tablet Take 10 mg by mouth at bedtime.   2 06/21/2018 at Unknown time  . valsartan-hydrochlorothiazide (DIOVAN-HCT) 160-25 MG tablet Take 0.5 tablets by mouth daily.   06/21/2018 at Unknown time    Current Facility-Administered Medications  Medication Dose Route Frequency Provider Last Rate Last Dose  . bupivacaine liposome (EXPAREL) 1.3 % injection 266 mg  20 mL Infiltration On Call to OR Michael Boston, MD      . ceFAZolin (ANCEF) IVPB 2g/100 mL premix  2 g Intravenous On Call to OR Michael Boston, MD      . Chlorhexidine Gluconate Cloth 2 % PADS 6 each  6 each Topical Once Michael Boston, MD       And  . Chlorhexidine Gluconate Cloth 2 % PADS 6 each  6 each Topical Once Michael Boston, MD      . lactated ringers infusion   Intravenous Continuous Annye Asa, MD 20 mL/hr at 06/22/18 [phone]    . scopolamine (TRANSDERM-SCOP) 1 MG/3DAYS 1.5 mg  1 patch Transdermal Once Annye Asa, MD   1.5 mg at 06/22/18 0605     No Known Allergies  BP 137/86   Pulse 68   Temp 97.8 F (36.6 C) (Oral)   Resp 18   Ht 5\' 10"  (1.778 m)   Wt 103 kg   SpO2 99%   BMI 32.57 kg/m    Labs: No results found for this or any previous visit (from the past 48 hour(s)).  Imaging / Studies: No results found.   [name], M.D., F.A.C.S. Gastrointestinal and Minimally Invasive Surgery [facility] [address] [place], [place] [address] [phone] Main /  Paging  06/22/2018 7:33 AM    [name]

## 2018-06-23 ENCOUNTER — Encounter (HOSPITAL_COMMUNITY): Payer: Self-pay | Admitting: Surgery

## 2018-09-26 ENCOUNTER — Inpatient Hospital Stay (HOSPITAL_BASED_OUTPATIENT_CLINIC_OR_DEPARTMENT_OTHER): Payer: BC Managed Care – PPO | Admitting: Oncology

## 2018-09-26 ENCOUNTER — Other Ambulatory Visit: Payer: Self-pay

## 2018-09-26 ENCOUNTER — Inpatient Hospital Stay: Payer: BC Managed Care – PPO | Attending: Oncology

## 2018-09-26 ENCOUNTER — Telehealth: Payer: Self-pay | Admitting: Oncology

## 2018-09-26 VITALS — BP 122/75 | HR 58 | Temp 99.1°F | Resp 18 | Ht 70.0 in | Wt 227.3 lb

## 2018-09-26 DIAGNOSIS — Z800 Family history of malignant neoplasm of digestive organs: Secondary | ICD-10-CM | POA: Insufficient documentation

## 2018-09-26 DIAGNOSIS — Z85048 Personal history of other malignant neoplasm of rectum, rectosigmoid junction, and anus: Secondary | ICD-10-CM | POA: Diagnosis present

## 2018-09-26 DIAGNOSIS — Z87442 Personal history of urinary calculi: Secondary | ICD-10-CM

## 2018-09-26 DIAGNOSIS — C20 Malignant neoplasm of rectum: Secondary | ICD-10-CM

## 2018-09-26 DIAGNOSIS — M109 Gout, unspecified: Secondary | ICD-10-CM

## 2018-09-26 LAB — CEA (IN HOUSE-CHCC): CEA (CHCC-In House): 1.82 ng/mL (ref 0.00–5.00)

## 2018-09-26 NOTE — Telephone Encounter (Signed)
Scheduled appt per 5/19 los. ° °Patient aware of appt date and time. °

## 2018-09-26 NOTE — Progress Notes (Signed)
  [place] OFFICE PROGRESS NOTE   Diagnosis: Rectal cancer  INTERVAL HISTORY:   Jon Frost returns as scheduled.  He feels well.  No difficulty with bowel function.  Good appetite and energy level.  He developed erythema of the dorsum of the left foot after working in his garage on 09/21/2018.  The erythema has improved, but has not completely resolved.  Objective:  Vital signs in last 24 hours:  Blood pressure 122/75, pulse (!) 58, temperature 99.1 F (37.3 C), temperature source Oral, resp. rate 18, height '5\' 10"'$  (1.778 m), weight 227 lb 4.8 oz (103.1 kg), SpO2 100 %.    HEENT: Neck without mass Lymphatics: No cervical, supraclavicular, axillary, or inguinal nodes Resp: Lungs clear bilaterally Cardio: Regular rate and rhythm GI: No hepatosplenomegaly, no mass, nontender Vascular: No leg edema  Skin: There is an area of erythema and induration at the dorsum of the left foot overlying the extensor tendon of the great toe.  There is a soft vein distal to the area of erythema.  The area of induration measures approximately 4 cm and is linear, 1 mm area of white discoloration centrally   Lab Results:  Lab Results  Component Value Date   WBC 5.1 06/09/2018   HGB 15.0 06/09/2018   HCT 44.6 06/09/2018   MCV 96.5 06/09/2018   PLT 150 06/09/2018    CMP  Lab Results  Component Value Date   NA 140 06/09/2018   K 4.6 06/09/2018   CL 106 06/09/2018   CO2 27 06/09/2018   GLUCOSE 106 (H) 06/09/2018   BUN 14 06/09/2018   CREATININE 1.12 06/09/2018   CALCIUM 9.7 06/09/2018   GFRNONAA >60 06/09/2018   GFRAA >60 06/09/2018    Lab Results  Component Value Date   CEA1 1.82 09/26/2018    Medications: I have reviewed the patient's current medications.   Assessment/Plan: 1. Rectal cancer, stage II (T3 N0), proximal rectum, status post a low anterior resection 11/13/2015 ? Microsatellite stable, no loss of mismatch repair protein expression ? 15 negative lymph  nodes, no lymphovascular or perineural invasion ? Grade 1 ? Colonoscopy 04/09/2016-polyp was removed from the descending and transverse colon- tubular adenomas and sessile serrated polyp, polyp at the sigmoid anastomosis-polypoid colonic mucosa  2. History of kidney stones  3. Family history of colon cancer  4. Multiple polyps noted on the colonoscopy 10/08/2015  5.Gout    Disposition: Jon Frost is in clinical remission from rectal cancer.  He would like to continue follow-up at the Cancer center.  He will return for an office visit and CEA in 1 year.  He will schedule a surveillance colonoscopy with Dr. Watt Climes for December of this year.  The area of erythema and induration of the dorsum of the left foot may be related to an insect bite, superficial phlebitis, infection, or less likely gout.  He will use warm compresses and take an aspirin daily for 1 week.  He will follow-up with his primary physician if the erythema does not resolve.  Betsy Coder, MD  09/26/2018  12:49 PM

## 2018-09-27 ENCOUNTER — Other Ambulatory Visit: Payer: Self-pay | Admitting: Internal Medicine

## 2018-09-27 DIAGNOSIS — I712 Thoracic aortic aneurysm, without rupture, unspecified: Secondary | ICD-10-CM

## 2018-09-28 ENCOUNTER — Telehealth: Payer: Self-pay | Admitting: Oncology

## 2018-09-28 NOTE — Telephone Encounter (Signed)
Returned patient's phone call regarding rescheduling an appointment, left a voicemail. 

## 2018-09-29 ENCOUNTER — Ambulatory Visit: Payer: BLUE CROSS/BLUE SHIELD | Admitting: Oncology

## 2018-09-29 ENCOUNTER — Other Ambulatory Visit: Payer: BLUE CROSS/BLUE SHIELD

## 2018-10-06 ENCOUNTER — Telehealth: Payer: Self-pay | Admitting: *Deleted

## 2018-10-06 NOTE — Telephone Encounter (Signed)
Wife requesting to reschedule his 1 year follow up appointment. Scheduling message sent

## 2018-10-09 ENCOUNTER — Telehealth: Payer: Self-pay | Admitting: Oncology

## 2018-10-09 NOTE — Telephone Encounter (Signed)
Spoke with wife re new appointment for 09/27/2019. Rescheduled per 5/29 schedule message. Date per wife.

## 2018-10-13 ENCOUNTER — Ambulatory Visit
Admission: RE | Admit: 2018-10-13 | Discharge: 2018-10-13 | Disposition: A | Discharge status: Home / Self Care | Payer: BC Managed Care – PPO | Source: Ambulatory Visit | Attending: Internal Medicine | Admitting: Internal Medicine

## 2018-10-13 DIAGNOSIS — I712 Thoracic aortic aneurysm, without rupture, unspecified: Secondary | ICD-10-CM

## 2018-10-13 MED ORDER — IOPAMIDOL (ISOVUE-370) INJECTION 76%
75.0000 mL | Freq: Once | INTRAVENOUS | Status: AC | PRN
  Administered 2018-10-13: 75 mL via INTRAVENOUS

## 2019-03-20 ENCOUNTER — Encounter: Payer: Self-pay | Admitting: Oncology

## 2019-09-27 ENCOUNTER — Telehealth: Payer: Self-pay | Admitting: Oncology

## 2019-09-27 ENCOUNTER — Other Ambulatory Visit: Payer: Self-pay

## 2019-09-27 ENCOUNTER — Encounter: Payer: Self-pay | Admitting: Oncology

## 2019-09-27 ENCOUNTER — Inpatient Hospital Stay (HOSPITAL_BASED_OUTPATIENT_CLINIC_OR_DEPARTMENT_OTHER): Payer: BC Managed Care – PPO | Admitting: Oncology

## 2019-09-27 ENCOUNTER — Inpatient Hospital Stay: Payer: BC Managed Care – PPO | Attending: Oncology

## 2019-09-27 VITALS — BP 112/66 | HR 72 | Temp 97.3°F | Resp 17 | Ht 70.0 in | Wt 235.7 lb

## 2019-09-27 DIAGNOSIS — Z86010 Personal history of colonic polyps: Secondary | ICD-10-CM | POA: Insufficient documentation

## 2019-09-27 DIAGNOSIS — C20 Malignant neoplasm of rectum: Secondary | ICD-10-CM

## 2019-09-27 DIAGNOSIS — Z85048 Personal history of other malignant neoplasm of rectum, rectosigmoid junction, and anus: Secondary | ICD-10-CM | POA: Diagnosis not present

## 2019-09-27 DIAGNOSIS — Z87442 Personal history of urinary calculi: Secondary | ICD-10-CM | POA: Insufficient documentation

## 2019-09-27 NOTE — Progress Notes (Signed)
  [place] OFFICE PROGRESS NOTE   Diagnosis: Rectal cancer  INTERVAL HISTORY:   Mr. Cedeno returns for a scheduled visit.  He feels well.  He has rectal urgency.  Good appetite.  He passed a kidney stone yesterday.  He saw Dr. Brigitte Pulse for a physical today.  He had a colonoscopy by Dr. Watt Climes in January.  Objective:  Vital signs in last 24 hours:  Blood pressure 112/66, pulse 72, temperature (!) 97.3 F (36.3 C), temperature source Temporal, resp. rate 17, height _0  (1.778 m), weight 235 lb 11.2 oz (106.9 kg), SpO2 99 %.    Lymphatics: No cervical, supraclavicular, axillary, or inguinal nodes GI: No hepatosplenomegaly, no mass, nontender Vascular: No leg edema    Portacath/PICC-without erythema  Lab Results:  Lab Results  Component Value Date   WBC 5.1 06/09/2018   HGB 15.0 06/09/2018   HCT 44.6 06/09/2018   MCV 96.5 06/09/2018   PLT 150 06/09/2018    CMP  Lab Results  Component Value Date   NA 140 06/09/2018   K 4.6 06/09/2018   CL 106 06/09/2018   CO2 27 06/09/2018   GLUCOSE 106 (H) 06/09/2018   BUN 14 06/09/2018   CREATININE 1.12 06/09/2018   CALCIUM 9.7 06/09/2018   GFRNONAA >60 06/09/2018   GFRAA >60 06/09/2018    Lab Results  Component Value Date   CEA1 1.82 09/26/2018    Medications: I have reviewed the patient's current medications.   Assessment/Plan: 1. Rectal cancer, stage II (T3 N0), proximal rectum, status post a low anterior resection 11/13/2015 ? Microsatellite stable, no loss of mismatch repair protein expression ? 15 negative lymph nodes, no lymphovascular or perineural invasion ? Grade 1 ? Colonoscopy 04/09/2016-polyp was removed from the descending and transverse colon- tubular adenomas and sessile serrated polyp, polyp at the sigmoid anastomosis-polypoid colonic mucosa  2. History of kidney stones  3. Family history of colon cancer  4. Multiple polyps noted on the colonoscopy  10/08/2015  5.Gout    Disposition: Mr. Gandolfo is in clinical remission from rectal cancer.  We will follow up on the CEA from today.  He would like to continue follow-up at the Cancer center.  He will return for an office visit in 1 year.  He will continue colonoscopy surveillance with Dr. Watt Climes.  I encouraged him to obtain the COVID-19 vaccine.  Betsy Coder, MD  09/27/2019  12:45 PM

## 2019-09-27 NOTE — Telephone Encounter (Signed)
Scheduled appt per 5/20 los - pt aware of appts.

## 2019-09-28 ENCOUNTER — Other Ambulatory Visit: Payer: Self-pay | Admitting: *Deleted

## 2019-09-28 ENCOUNTER — Encounter: Payer: Self-pay | Admitting: *Deleted

## 2019-09-28 ENCOUNTER — Other Ambulatory Visit: Payer: BLUE CROSS/BLUE SHIELD

## 2019-09-28 ENCOUNTER — Ambulatory Visit: Payer: BLUE CROSS/BLUE SHIELD | Admitting: Oncology

## 2019-09-28 DIAGNOSIS — C20 Malignant neoplasm of rectum: Secondary | ICD-10-CM

## 2020-06-24 ENCOUNTER — Telehealth: Payer: Self-pay | Admitting: Oncology

## 2020-06-24 NOTE — Telephone Encounter (Signed)
Contacted patient about rescheduled appointment. Patient is aware

## 2020-10-14 ENCOUNTER — Ambulatory Visit: Payer: BC Managed Care – PPO | Admitting: Oncology

## 2020-10-14 ENCOUNTER — Other Ambulatory Visit: Payer: BC Managed Care – PPO

## 2020-10-15 ENCOUNTER — Other Ambulatory Visit: Payer: Self-pay | Admitting: Internal Medicine

## 2020-10-15 DIAGNOSIS — I712 Thoracic aortic aneurysm, without rupture, unspecified: Secondary | ICD-10-CM

## 2020-10-20 ENCOUNTER — Other Ambulatory Visit: Payer: BC Managed Care – PPO

## 2020-10-20 ENCOUNTER — Inpatient Hospital Stay: Payer: BC Managed Care – PPO

## 2020-10-20 ENCOUNTER — Inpatient Hospital Stay: Payer: BC Managed Care – PPO | Admitting: Oncology

## 2020-10-20 ENCOUNTER — Ambulatory Visit: Payer: BC Managed Care – PPO | Admitting: Oncology

## 2020-10-20 ENCOUNTER — Telehealth: Payer: Self-pay | Admitting: Oncology

## 2020-10-20 NOTE — Telephone Encounter (Signed)
I called and advised patient that his appointment w/ Dr Benay Spice 10/20/2020 will need to be rescheduled out 4 weeks per Ned Card.  Patient was very upset that this was having to be changed.  He did nto want to reschedule and stated that he would call back.  I did apologize for the short notice.  He was still not happy at all.

## 2020-10-31 ENCOUNTER — Inpatient Hospital Stay: Payer: BC Managed Care – PPO | Attending: Oncology

## 2020-10-31 ENCOUNTER — Encounter: Payer: Self-pay | Admitting: Oncology

## 2020-10-31 ENCOUNTER — Inpatient Hospital Stay (HOSPITAL_BASED_OUTPATIENT_CLINIC_OR_DEPARTMENT_OTHER): Payer: BC Managed Care – PPO | Admitting: Oncology

## 2020-10-31 ENCOUNTER — Telehealth: Payer: Self-pay | Admitting: *Deleted

## 2020-10-31 ENCOUNTER — Ambulatory Visit
Admission: RE | Admit: 2020-10-31 | Discharge: 2020-10-31 | Disposition: A | Discharge status: Home / Self Care | Payer: BC Managed Care – PPO | Source: Ambulatory Visit | Attending: Internal Medicine | Admitting: Internal Medicine

## 2020-10-31 ENCOUNTER — Other Ambulatory Visit: Payer: Self-pay

## 2020-10-31 VITALS — BP 124/85 | HR 57 | Temp 97.7°F | Resp 18 | Ht 70.0 in | Wt 226.0 lb

## 2020-10-31 DIAGNOSIS — C20 Malignant neoplasm of rectum: Secondary | ICD-10-CM | POA: Insufficient documentation

## 2020-10-31 DIAGNOSIS — I712 Thoracic aortic aneurysm, without rupture, unspecified: Secondary | ICD-10-CM

## 2020-10-31 LAB — CEA (IN HOUSE-CHCC): CEA (CHCC-In House): 35.97 ng/mL — ABNORMAL HIGH (ref 0.00–5.00)

## 2020-10-31 LAB — CEA (ACCESS): CEA (CHCC): 28.96 ng/mL — ABNORMAL HIGH (ref 0.00–5.00)

## 2020-10-31 MED ORDER — IOPAMIDOL (ISOVUE-370) INJECTION 76%
75.0000 mL | Freq: Once | INTRAVENOUS | Status: AC | PRN
  Administered 2020-10-31: 75 mL via INTRAVENOUS

## 2020-10-31 NOTE — Telephone Encounter (Signed)
Informed wife that MD reviewed the CEA results and suggests to repeat lab in 1-2 weeks and if still high will order CT scan. Confirmed he is not a smoker and had a gout flare about 2 months ago. Scheduled lab for 7/8 and he will pick up oral contrast in [place] a scan is ordered.

## 2020-10-31 NOTE — Progress Notes (Signed)
  [place] OFFICE PROGRESS NOTE   Diagnosis: Rectal cancer  INTERVAL HISTORY:   Jon. Mates returns as scheduled.  He feels well.  He reports intentional weight loss.  Good appetite.  No difficulty with bowel function.  No bleeding.  Objective:  Vital signs in last 24 hours:  Blood pressure 124/85, pulse (!) 57, temperature 97.7 F (36.5 C), temperature source Oral, resp. rate 18, height $RemoveBe'5\' 10"'[id number]$  (1.778 m), weight 226 lb (102.5 kg), SpO2 99 %.    Lymphatics: No cervical, supraclavicular, axillary, or inguinal nodes Resp: Lungs clear bilaterally Cardio: Regular rate and rhythm GI: No hepatosplenomegaly, nontender, firm oblong area of fullness in the right abdominal wall beneath the right lower quadrant scar (he reports this has been present chronically and has not changed) Vascular: No leg edema  Lab Results:  Lab Results  Component Value Date   WBC 5.1 06/09/2018   HGB 15.0 06/09/2018   HCT 44.6 06/09/2018   MCV 96.5 06/09/2018   PLT 150 06/09/2018    CMP  Lab Results  Component Value Date   NA 140 06/09/2018   K 4.6 06/09/2018   CL 106 06/09/2018   CO2 27 06/09/2018   GLUCOSE 106 (H) 06/09/2018   BUN 14 06/09/2018   CREATININE 1.12 06/09/2018   CALCIUM 9.7 06/09/2018   GFRNONAA >60 06/09/2018   GFRAA >60 06/09/2018    Lab Results  Component Value Date   CEA1 1.82 09/26/2018    Medications: I have reviewed the patient's current medications.   Assessment/Plan: Rectal cancer, stage II (T3 N0), proximal rectum, status post a low anterior resection 11/13/2015 Microsatellite stable, no loss of mismatch repair protein expression 15 negative lymph nodes, no lymphovascular or perineural invasion Grade 1 Colonoscopy 04/09/2016-polyp was removed from the descending and transverse colon- tubular adenomas and sessile serrated polyp, polyp at the sigmoid anastomosis-polypoid colonic mucosa Colonoscopy 05/31/2019-diverticulosis, internal hemorrhoids    History of kidney stones   3.   Family history of colon cancer   4.   Multiple polyps noted on the colonoscopy 10/08/2015   5.   Gout      Disposition: Jon Frost remains in clinical remission from rectal cancer.  We will follow-up on the CEA from today.  The firm fullness inferior to the right lower quadrant scar is likely scar tissue from surgery.  He will call if this area changes.  Jon. Deeb would like to continue follow-up with the Cancer center.  He will return for an office visit in 1 year.  He continues colonoscopy surveillance with Dr. Watt Climes.  Betsy Coder, MD  10/31/2020  8:14 AM

## 2020-11-14 ENCOUNTER — Telehealth: Payer: Self-pay

## 2020-11-14 ENCOUNTER — Other Ambulatory Visit: Payer: Self-pay

## 2020-11-14 ENCOUNTER — Inpatient Hospital Stay: Payer: BC Managed Care – PPO | Attending: Oncology

## 2020-11-14 DIAGNOSIS — D123 Benign neoplasm of transverse colon: Secondary | ICD-10-CM | POA: Insufficient documentation

## 2020-11-14 DIAGNOSIS — Z87442 Personal history of urinary calculi: Secondary | ICD-10-CM | POA: Diagnosis not present

## 2020-11-14 DIAGNOSIS — D125 Benign neoplasm of sigmoid colon: Secondary | ICD-10-CM | POA: Diagnosis not present

## 2020-11-14 DIAGNOSIS — K648 Other hemorrhoids: Secondary | ICD-10-CM | POA: Insufficient documentation

## 2020-11-14 DIAGNOSIS — Z79899 Other long term (current) drug therapy: Secondary | ICD-10-CM | POA: Diagnosis not present

## 2020-11-14 DIAGNOSIS — M109 Gout, unspecified: Secondary | ICD-10-CM | POA: Diagnosis not present

## 2020-11-14 DIAGNOSIS — C20 Malignant neoplasm of rectum: Secondary | ICD-10-CM | POA: Diagnosis present

## 2020-11-14 DIAGNOSIS — Z800 Family history of malignant neoplasm of digestive organs: Secondary | ICD-10-CM | POA: Insufficient documentation

## 2020-11-14 LAB — CEA (ACCESS): CEA (CHCC): 31.67 ng/mL — ABNORMAL HIGH (ref 0.00–5.00)

## 2020-11-14 LAB — CEA (IN HOUSE-CHCC): CEA (CHCC-In House): 35.58 ng/mL — ABNORMAL HIGH (ref 0.00–5.00)

## 2020-11-14 NOTE — Telephone Encounter (Signed)
TC to Pt informed him of lab results and Dr Gearldine Shown plan for CT scan and office visit informed Pt he will receive a call when Ct scan is scheduled. Pt verbalized understanding.

## 2020-11-14 NOTE — Telephone Encounter (Signed)
-----   Message from Ladell Pier, MD sent at 11/14/2020  3:35 PM EDT ----- Please call patient, CEA is still elevated, schedule CTs chest, abdomen, and pelvis with contrast next 2 weeks, office 1-2 days after the CTs, 30 minutes, will need BMP day of CT

## 2020-11-17 ENCOUNTER — Telehealth: Payer: Self-pay | Admitting: Oncology

## 2020-11-17 ENCOUNTER — Other Ambulatory Visit: Payer: Self-pay | Admitting: *Deleted

## 2020-11-17 DIAGNOSIS — C20 Malignant neoplasm of rectum: Secondary | ICD-10-CM

## 2020-11-17 NOTE — Progress Notes (Signed)
Due to rising CEA, MD has ordered CT C/A/P with contrast in next 2 weeks.  CT scheduled for 7/20 at 11am w/lab at 1000. NPO 4 hours prior and drink contrast at 0900 and 1000. Wife notified and confirmed they do have oral contrast at home. Sent [facility] scheduling message to schedule OV day after scan.

## 2020-11-17 NOTE — Telephone Encounter (Signed)
Scheduled appt per 7/11 sch msg - pt wife is aware of appt date and itme

## 2020-11-26 ENCOUNTER — Other Ambulatory Visit: Payer: Self-pay

## 2020-11-26 ENCOUNTER — Telehealth: Payer: Self-pay | Admitting: *Deleted

## 2020-11-26 ENCOUNTER — Inpatient Hospital Stay: Payer: BC Managed Care – PPO

## 2020-11-26 ENCOUNTER — Inpatient Hospital Stay (HOSPITAL_BASED_OUTPATIENT_CLINIC_OR_DEPARTMENT_OTHER): Payer: BC Managed Care – PPO | Admitting: Oncology

## 2020-11-26 ENCOUNTER — Ambulatory Visit (HOSPITAL_BASED_OUTPATIENT_CLINIC_OR_DEPARTMENT_OTHER)
Admission: RE | Admit: 2020-11-26 | Discharge: 2020-11-26 | Disposition: A | Discharge status: Home / Self Care | Payer: BC Managed Care – PPO | Source: Ambulatory Visit | Attending: Oncology | Admitting: Oncology

## 2020-11-26 ENCOUNTER — Encounter (HOSPITAL_BASED_OUTPATIENT_CLINIC_OR_DEPARTMENT_OTHER): Payer: Self-pay

## 2020-11-26 VITALS — BP 134/87 | HR 60 | Temp 97.9°F | Resp 16 | Wt 226.4 lb

## 2020-11-26 DIAGNOSIS — C20 Malignant neoplasm of rectum: Secondary | ICD-10-CM

## 2020-11-26 LAB — BASIC METABOLIC PANEL - CANCER CENTER ONLY
Anion gap: 7 (ref 5–15)
BUN: 16 mg/dL (ref 6–20)
CO2: 30 mmol/L (ref 22–32)
Calcium: 9.5 mg/dL (ref 8.9–10.3)
Chloride: 102 mmol/L (ref 98–111)
Creatinine: 1.12 mg/dL (ref 0.61–1.24)
GFR, Estimated: >60 mL/min (ref >60)
Glucose, Bld: 106 mg/dL — ABNORMAL HIGH (ref 70–99)
Potassium: 4.1 mmol/L (ref 3.5–5.1)
Sodium: 139 mmol/L (ref 135–145)

## 2020-11-26 MED ORDER — IOHEXOL 350 MG/ML SOLN
85.0000 mL | Freq: Once | INTRAVENOUS | Status: AC | PRN
  Administered 2020-11-26: 85 mL via INTRAVENOUS

## 2020-11-26 NOTE — Telephone Encounter (Signed)
Just had labs for CT scan and is going for scan at 11:00 today. Asking if Dr. Benay Spice could work him in this afternoon to avoid drive tomorrow?

## 2020-11-26 NOTE — Telephone Encounter (Signed)
Informed wife that Dr. Benay Spice will work him in today at 12:15 pm.

## 2020-11-26 NOTE — Progress Notes (Signed)
  [facility] OFFICE PROGRESS NOTE   Diagnosis: Rectal cancer  INTERVAL HISTORY:   [name] returns for a scheduled visit.  When he was here on 10/31/2020 the CEA returned elevated.  A repeat CEA on 11/14/2020 was again elevated.  He reports feeling well.  He has irregular bowel habits since undergoing colon surgery.  No other complaint.  Objective:  Vital signs in last 24 hours:  Blood pressure 134/87, pulse 60, temperature 97.9 F (36.6 C), temperature source Temporal, resp. rate 16, weight 226 lb 6 oz (102.7 kg), SpO2 99 %.   Lymphatics: No cervical, supraclavicular, axillary, or inguinal nodes GI: No hepatosplenomegaly.  Firm smooth fullness deep to the right abdomen scar extending over an area of approximately 10 cm.   Lab Results:  Lab Results  Component Value Date   WBC 5.1 06/09/2018   HGB 15.0 06/09/2018   HCT 44.6 06/09/2018   MCV 96.5 06/09/2018   PLT 150 06/09/2018    CMP  Lab Results  Component Value Date   NA 139 11/26/2020   K 4.1 11/26/2020   CL 102 11/26/2020   CO2 30 11/26/2020   GLUCOSE 106 (H) 11/26/2020   BUN 16 11/26/2020   CREATININE 1.12 11/26/2020   CALCIUM 9.5 11/26/2020   GFRNONAA >60 11/26/2020   GFRAA >60 06/09/2018    Lab Results  Component Value Date   CEA1 35.58 (H) 11/14/2020   CEA 31.67 (H) 11/14/2020     Medications: I have reviewed the patient's current medications.   Assessment/Plan: Rectal cancer, stage II (T3 N0), proximal rectum, status post a low anterior resection 11/13/2015 Microsatellite stable, no loss of mismatch repair protein expression 15 negative lymph nodes, no lymphovascular or perineural invasion Grade 1 Colonoscopy 04/09/2016-polyp was removed from the descending and transverse colon- tubular adenomas and sessile serrated polyp, polyp at the sigmoid anastomosis-polypoid colonic mucosa Colonoscopy 05/31/2019-diverticulosis, internal hemorrhoids Elevated CEA June 2022   History of kidney  stones   3.   Family history of colon cancer   4.   Multiple polyps noted on the colonoscopy 10/08/2015   5.   Gout       Disposition: [name] appears unchanged.  He has history of rectal cancer dating to July 2017.  The CEA is elevated.  The only physical exam finding of concern is firm fullness deep to the right abdomen scar.  I reviewed today's CT images with Mr. Kinnear and his wife.  The official report is pending.  I see no clear evidence of metastatic disease, though there is asymmetry in the abdominal wall musculature at the area of the palpable finding.  The plan is to wait on the final CT report.  We will arrange for a diagnostic biopsy if there is an area of concern on the CT.  If the CT is read as negative we will submit peripheral blood for circulating tumor DNA testing.  We discussed the differential diagnosis including recurrent rectal cancer, a new colorectal tumor, and other malignancies.  It is also possible the elevated CEA is related to a benign finding.  He will return for an office visit in 3 weeks.  Betsy Coder, MD  11/26/2020  1:39 PM

## 2020-11-27 ENCOUNTER — Inpatient Hospital Stay: Payer: BC Managed Care – PPO | Admitting: Oncology

## 2020-12-12 LAB — GUARDANT 360

## 2020-12-16 ENCOUNTER — Inpatient Hospital Stay: Payer: BC Managed Care – PPO | Attending: Oncology | Admitting: Oncology

## 2020-12-16 ENCOUNTER — Other Ambulatory Visit: Payer: Self-pay

## 2020-12-16 VITALS — BP 131/88 | HR 60 | Temp 97.9°F | Resp 18 | Ht 70.0 in | Wt 228.2 lb

## 2020-12-16 DIAGNOSIS — Z800 Family history of malignant neoplasm of digestive organs: Secondary | ICD-10-CM | POA: Diagnosis not present

## 2020-12-16 DIAGNOSIS — M109 Gout, unspecified: Secondary | ICD-10-CM | POA: Insufficient documentation

## 2020-12-16 DIAGNOSIS — C20 Malignant neoplasm of rectum: Secondary | ICD-10-CM | POA: Diagnosis not present

## 2020-12-16 DIAGNOSIS — R970 Elevated carcinoembryonic antigen [CEA]: Secondary | ICD-10-CM | POA: Insufficient documentation

## 2020-12-16 DIAGNOSIS — Z87442 Personal history of urinary calculi: Secondary | ICD-10-CM | POA: Insufficient documentation

## 2020-12-16 NOTE — Progress Notes (Signed)
  [facility] OFFICE PROGRESS NOTE   Diagnosis: Rectal cancer  INTERVAL HISTORY:   Mr. Rahimi returns as scheduled.  He feels well.  No complaint.  Objective:  Vital signs in last 24 hours:  Blood pressure 131/88, pulse 60, temperature 97.9 F (36.6 C), temperature source Oral, resp. rate 18, height $RemoveBe'5\' 10"'JRNEGuSnK$  (1.778 m), weight 228 lb 3.2 oz (103.5 kg), SpO2 100 %.    GI: No hepatomegaly, nontender, smooth oblong masslike fullness deep to the right lower quadrant transverse scar without associated tenderness   Lab Results:  Lab Results  Component Value Date   WBC 5.1 06/09/2018   HGB 15.0 06/09/2018   HCT 44.6 06/09/2018   MCV 96.5 06/09/2018   PLT 150 06/09/2018    CMP  Lab Results  Component Value Date   NA 139 11/26/2020   K 4.1 11/26/2020   CL 102 11/26/2020   CO2 30 11/26/2020   GLUCOSE 106 (H) 11/26/2020   BUN 16 11/26/2020   CREATININE 1.12 11/26/2020   CALCIUM 9.5 11/26/2020   GFRNONAA >60 11/26/2020   GFRAA >60 06/09/2018    Lab Results  Component Value Date   CEA1 35.58 (H) 11/14/2020   CEA 31.67 (H) 11/14/2020     Medications: I have reviewed the patient's current medications.   Assessment/Plan: Rectal cancer, stage II (T3 N0), proximal rectum, status post a low anterior resection 11/13/2015 Microsatellite stable, no loss of mismatch repair protein expression 15 negative lymph nodes, no lymphovascular or perineural invasion Grade 1 Colonoscopy 04/09/2016-polyp was removed from the descending and transverse colon- tubular adenomas and sessile serrated polyp, polyp at the sigmoid anastomosis-polypoid colonic mucosa Colonoscopy 05/31/2019-diverticulosis, internal hemorrhoids Elevated CEA June 2022 CT 11/26/2020-no evidence of recurrent disease, mild circumferential wall thickening in the distal esophagus Guardant reveal 11/26/2020-ctDNA not detected   History of kidney stones   3.   Family history of colon cancer   4.   Multiple  polyps noted on the colonoscopy 10/08/2015   5.   Gout       Disposition: Mr Rothermel appears stable.  The CEA has been repeatedly elevated over the past 2 months.  CTs revealed no evidence of recurrent rectal cancer and a guardant reveal test did not reveal circulating tumor DNA.  I discussed the diagnostic findings with Mr. Imhof and his wife.  He understands the elevated CEA could still rate flecked recurrent rectal cancer or another malignancy.  The plan is to obtain a repeat CEA in approximately 1 month.  If the CEA remains elevated or rises we will consider repeat guardant reveal testing and a PET evaluation.  I have a low clinical suspicion for a new colorectal primary as he had a negative colonoscopy in January 2021.  Mr Wendling is in agreement with this plan.  Betsy Coder, MD  12/16/2020  8:04 AM

## 2020-12-24 ENCOUNTER — Other Ambulatory Visit: Payer: Self-pay

## 2020-12-24 NOTE — Progress Notes (Signed)
The proposed treatment discussed in conference is for discussion purpose only and is not a binding recommendation.  The patients have not been physically examined, or presented with their treatment options.  Therefore, final treatment plans cannot be decided.  

## 2020-12-26 ENCOUNTER — Encounter (HOSPITAL_COMMUNITY): Payer: Self-pay | Admitting: Radiology

## 2020-12-26 ENCOUNTER — Other Ambulatory Visit: Payer: Self-pay | Admitting: Oncology

## 2020-12-26 DIAGNOSIS — C20 Malignant neoplasm of rectum: Secondary | ICD-10-CM

## 2020-12-26 NOTE — Progress Notes (Signed)
Patient Name  Jon Frost, Jon Frost Sex  Male DOB  1960/09/20 SSN  [id number] Address  [address]  [place] [address] Phone  [phone] [facility])  [phone] ([place])    RE: CT Biopsy Received: Today Markus Daft, MD  Arlyn Leak for CT guided biopsy of right abdominal wall thickening.  See CT 11/26/20, seq 2, im 91.     [place]        Previous Messages   ----- Message -----  From: Garth Bigness D  Sent: 12/26/2020   5:01 PM EDT  To: Ir Procedure Requests  Subject: CT Biopsy                                       Procedure:   CT Biopsy   Reason:   Rectal cancer, History of rectal cancer, rising CEA, asymmetry in the right abdominal wall soft tissue/musculature on CT, please biopsy abdominal wall mass   History:  CT in computer   Provider:  Ladell Pier   Provider Contact:   [phone]

## 2020-12-31 ENCOUNTER — Other Ambulatory Visit: Payer: Self-pay | Admitting: Radiology

## 2021-01-01 ENCOUNTER — Encounter (HOSPITAL_COMMUNITY): Payer: Self-pay

## 2021-01-01 ENCOUNTER — Ambulatory Visit (HOSPITAL_COMMUNITY)
Admission: RE | Admit: 2021-01-01 | Discharge: 2021-01-01 | Disposition: A | Discharge status: Home / Self Care | Payer: BC Managed Care – PPO | Source: Ambulatory Visit | Attending: Oncology | Admitting: Oncology

## 2021-01-01 ENCOUNTER — Other Ambulatory Visit: Payer: Self-pay

## 2021-01-01 ENCOUNTER — Other Ambulatory Visit: Payer: Self-pay | Admitting: Oncology

## 2021-01-01 DIAGNOSIS — C20 Malignant neoplasm of rectum: Secondary | ICD-10-CM | POA: Insufficient documentation

## 2021-01-01 HISTORY — PX: IR US GUIDE BX ASP/DRAIN: IMG2392

## 2021-01-01 LAB — BASIC METABOLIC PANEL
Anion gap: 6 (ref 5–15)
BUN: 17 mg/dL (ref 6–20)
CO2: 26 mmol/L (ref 22–32)
Calcium: 9.4 mg/dL (ref 8.9–10.3)
Chloride: 108 mmol/L (ref 98–111)
Creatinine, Ser: 1.07 mg/dL (ref 0.61–1.24)
GFR, Estimated: >60 mL/min (ref >60)
Glucose, Bld: 101 mg/dL — ABNORMAL HIGH (ref 70–99)
Potassium: 3.9 mmol/L (ref 3.5–5.1)
Sodium: 140 mmol/L (ref 135–145)

## 2021-01-01 LAB — CBC WITH DIFFERENTIAL/PLATELET
Abs Immature Granulocytes: 0.02 10*3/uL (ref 0.00–0.07)
Basophils Absolute: 0 10*3/uL (ref 0.0–0.1)
Basophils Relative: 0 %
Eosinophils Absolute: 0.3 10*3/uL (ref 0.0–0.5)
Eosinophils Relative: 8 %
HCT: 44.3 % (ref 39.0–52.0)
Hemoglobin: 15.5 g/dL (ref 13.0–17.0)
Immature Granulocytes: 0 %
Lymphocytes Relative: 16 %
Lymphs Abs: 0.7 10*3/uL (ref 0.7–4.0)
MCH: 33.4 pg (ref 26.0–34.0)
MCHC: 35 g/dL (ref 30.0–36.0)
MCV: 95.5 fL (ref 80.0–100.0)
Monocytes Absolute: 0.6 10*3/uL (ref 0.1–1.0)
Monocytes Relative: 13 %
Neutro Abs: 2.8 10*3/uL (ref 1.7–7.7)
Neutrophils Relative %: 63 %
Platelets: 128 10*3/uL — ABNORMAL LOW (ref 150–400)
RBC: 4.64 MIL/uL (ref 4.22–5.81)
RDW: 13.1 % (ref 11.5–15.5)
WBC: 4.5 10*3/uL (ref 4.0–10.5)
nRBC: 0 % (ref 0.0–0.2)

## 2021-01-01 LAB — PROTIME-INR
INR: 1.1 (ref 0.8–1.2)
Prothrombin Time: 14 seconds (ref 11.4–15.2)

## 2021-01-01 MED ORDER — MIDAZOLAM HCL 2 MG/2ML IJ SOLN
INTRAMUSCULAR | Status: AC | PRN
  Administered 2021-01-01: 1 mg via INTRAVENOUS
  Administered 2021-01-01: 2 mg via INTRAVENOUS

## 2021-01-01 MED ORDER — LIDOCAINE-EPINEPHRINE 1 %-1:100000 IJ SOLN
INTRAMUSCULAR | Status: AC | PRN
  Administered 2021-01-01: 10 mL

## 2021-01-01 MED ORDER — FENTANYL CITRATE (PF) 100 MCG/2ML IJ SOLN
INTRAMUSCULAR | Status: AC
  Filled 2021-01-01: qty 2

## 2021-01-01 MED ORDER — FENTANYL CITRATE (PF) 100 MCG/2ML IJ SOLN
INTRAMUSCULAR | Status: AC | PRN
  Administered 2021-01-01 (×2): 50 ug via INTRAVENOUS

## 2021-01-01 MED ORDER — HYDROCODONE-ACETAMINOPHEN 5-325 MG PO TABS: 1.0000 | ORAL_TABLET | ORAL | Status: DC | PRN

## 2021-01-01 MED ORDER — LIDOCAINE HCL 1 % IJ SOLN
INTRAMUSCULAR | Status: AC
  Filled 2021-01-01: qty 20

## 2021-01-01 MED ORDER — MIDAZOLAM HCL 2 MG/2ML IJ SOLN
INTRAMUSCULAR | Status: AC
  Filled 2021-01-01: qty 4

## 2021-01-01 MED ORDER — SODIUM CHLORIDE 0.9 % IV SOLN: INTRAVENOUS | Status: DC

## 2021-01-01 NOTE — H&P (Signed)
Chief Complaint: Patient was seen in consultation today for right abdominal wall thickening  Referring Physician(s): Sherrill,Gary B  Supervising Physician: Arne Cleveland  Patient Status: [facility] - Out-pt  History of Present Illness: Jon Frost is a 60 y.o. male with past medical history of HTN, CKD stage III, HTN, kidney stones, and rectal cancer presents for new asymmetry in the right abdominal wall with increasing CEA.  Referred to IR by Dr. Benay Spice for biopsy to rule out cancer recurrence.   Patient assessed in short stay.  He is in his usual state of health.  No new concerns or complaints.  He has been NPO. Does not take blood thinners.  He is aware of the goals of the goals of the procedure today and is agreeable to  proceed.   Past Medical History:  Diagnosis Date  . CKD (chronic kidney disease), stage III ([place])   . Gout   . History of kidney stones 2013  . Hypertension   . Hypertriglyceridemia   . Impaired fasting glucose   . Inguinal hernia   . Metabolic syndrome   . Obesity 11/13/2015  . Rectal cancer s/p robotic low anterior rectosigmoid resection 11/13/2015 11/13/2015  . Recurrent nephrolithiasis   . Snoring   . Tinnitus     Past Surgical History:  Procedure Laterality Date  . INGUINAL HERNIA REPAIR N/A 06/22/2018   Procedure: LAPAROSCOPIC BILATERAL INGUINAL HERNIA REPAIR WITH INSERTION OF MESH, TAP BLOCK,  ERAS PATHWAY;  Surgeon: Michael Boston, MD;  Location: [facility] ORS;  Service: General;  Laterality: N/A;  . XI ROBOTIC ASSISTED LOWER ANTERIOR RESECTION  2017   Proximal rectal cancer.  [name]    Allergies: Patient has no known allergies.  Medications: Prior to Admission medications   Medication Sig Start Date End Date Taking? Authorizing Provider  psyllium (METAMUCIL SMOOTH TEXTURE) 28 % packet Take 1 packet by mouth daily.   Yes [provider]  rosuvastatin (CRESTOR) 10 MG tablet Take 10 mg by mouth at bedtime.  02/07/18  Yes [provider]  valsartan-hydrochlorothiazide (DIOVAN-HCT) 160-25 MG tablet Take 0.5 tablets by mouth daily.   Yes [provider]  colchicine 0.6 MG tablet Take 0.6 mg by mouth daily as needed (Gout).  Patient not taking: Reported on 12/16/2020    [provider]     Family History  Problem Relation Age of Onset  . Diabetes Mother   . AAA (abdominal aortic aneurysm) Mother   . Diabetes Father   . CAD Father        CABG AGE 51  . Hypertension Father   . Diabetes Brother   . CVA Maternal Grandfather        in his 62's  . Cancer - Colon Paternal Grandfather     Social History   Socioeconomic History  . Marital status: Married    Spouse name: [name]  . Number of children: 0  . Years of education: Not on file  . Highest education level: Not on file  Occupational History  . Not on file  Tobacco Use  . Smoking status: Former    Packs/day: 0.50    Years: 15.00    Pack years: 7.50    Types: Cigarettes    Quit date: 11/09/1988    Years since quitting: 32.1  . Smokeless tobacco: Never  Vaping Use  . Vaping Use: Never used  Substance and Sexual Activity  . Alcohol use: Yes    Alcohol/week: 0.0 standard drinks    Comment:  glass wine SELDOM   . Drug use: No  . Sexual activity: Not on file    Comment: JAN  Other Topics Concern  . 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  All other systems are negative.  Review of Systems  Constitutional:  Negative for fatigue and fever.  Respiratory:  Negative for cough and shortness of breath.    Cardiovascular:  Negative for chest pain.  Gastrointestinal:  Negative for abdominal pain.  Musculoskeletal:  Negative for back pain.  Psychiatric/Behavioral:  Negative for behavioral problems and confusion.    Vital Signs: BP 138/83   Pulse 61   Temp 98.4 F (36.9 C) (Oral)   Resp 18   SpO2 98%   Physical Exam Vitals and nursing note reviewed.  Constitutional:      General: He is not in acute distress.    Appearance: Normal appearance. He is not ill-appearing.  HENT:     Mouth/Throat:     Mouth: Mucous membranes are moist.     Pharynx: Oropharynx is clear.  Cardiovascular:     Rate and Rhythm: Normal rate and regular rhythm.  Pulmonary:     Effort: Pulmonary effort is normal.     Breath sounds: Normal breath sounds.  Skin:    General: Skin is warm and dry.  Neurological:     General: No focal deficit present.     Mental Status: He is alert and oriented to person, place, and time.  Psychiatric:        Mood and Affect: Mood normal.        Behavior: Behavior normal.        Thought Content: Thought content normal.        Judgment: Judgment normal.     MD Evaluation Airway: WNL Heart: WNL Abdomen: WNL Chest/ Lungs: WNL ASA  Classification: 3 Mallampati/Airway Score: Two   Imaging: No results found.  Labs:  CBC: Recent Labs    01/01/21 0918  WBC 4.5  HGB 15.5  HCT 44.3  PLT 128*    COAGS: Recent Labs    01/01/21 0918  INR 1.1    BMP: Recent Labs    11/26/20 0948 01/01/21 0918  NA 139 140  K 4.1 3.9  CL 102 108  CO2 30 26  GLUCOSE 106* 101*  BUN 16 17  CALCIUM 9.5 9.4  CREATININE 1.12 1.07  GFRNONAA >60 >60    LIVER FUNCTION TESTS: No results for input(s): BILITOT, AST, ALT, ALKPHOS, PROT, ALBUMIN in the last 8760 hours.  TUMOR MARKERS: Recent Labs    10/31/20 0757 11/14/20 0849  CEA 28.96* 31.67*    Assessment and Plan: Patient with past medical history of rectal cancer, HTN presents with complaint of abdominal wall  thickening/asymmetry.  IR consulted for biopsy at the request of Dr. Benay Spice. Esteban reviewed by Dr. Anselm Pancoast who approves patient for procedure.  Patient presents today in their usual state of health.  He has been NPO and is not currently on blood thinners.  Risks and benefits of biopsy was discussed with the patient and/or patient's family including, but not limited to bleeding, infection, damage to adjacent structures or low yield requiring additional tests.  All of the questions were answered and there is agreement to proceed.  Consent signed and in chart.  Thank you for this interesting consult.  I greatly enjoyed meeting Jon Frost and look forward to participating in their care.  A copy of this report was sent to the requesting provider on this date.  Electronically Signed: Docia Barrier, PA 01/01/2021, 10:45 AM   I spent a total of  30 Minutes   in face to face in clinical consultation, greater than 50% of which was counseling/coordinating care for abdominal wall thickening.

## 2021-01-01 NOTE — Procedures (Signed)
  Procedure: [place] core RLQ body wall mass 18g x3 EBL:   minimal Complications:  none immediate  See full dictation in [employer].  Dillard Cannon MD Main # [phone] Pager  [phone]

## 2021-01-01 NOTE — Discharge Instructions (Signed)
For questions /concerns may call Interventional Radiology at [phone]  You may remove your dressing and shower tomorrow afternoon    Moderate Conscious Sedation, Adult, Care After This sheet gives you information about how to care for yourself after your procedure. Your health care provider may also give you more specific instructions. If you have problems or questions, contact your health careprovider. What can I expect after the procedure? After the procedure, it is common to have: Sleepiness for several hours. Impaired judgment for several hours. Difficulty with balance. Vomiting if you eat too soon. Follow these instructions at home: For the time period you were told by your health care provider: Rest. Do not participate in activities where you could fall or become injured. Do not drive or use machinery. Do not drink alcohol. Do not take sleeping pills or medicines that cause drowsiness. Do not make important decisions or sign legal documents. Do not take care of children on your own. Eating and drinking  Follow the diet recommended by your health care provider. Drink enough fluid to keep your urine pale yellow. If you vomit: Drink water, juice, or soup when you can drink without vomiting. Make sure you have little or no nausea before eating solid foods.  General instructions Take over-the-counter and prescription medicines only as told by your health care provider. Have a responsible adult stay with you for the time you are told. It is important to have someone help care for you until you are awake and alert. Do not smoke. Keep all follow-up visits as told by your health care provider. This is important. Contact a health care provider if: You are still sleepy or having trouble with balance after 24 hours. You feel light-headed. You keep feeling nauseous or you keep vomiting. You develop a rash. You have a fever. You have redness or swelling around the IV site. Get  help right away if: You have trouble breathing. You have new-onset confusion at home. Summary After the procedure, it is common to feel sleepy, have impaired judgment, or feel nauseous if you eat too soon. Rest after you get home. Know the things you should not do after the procedure. Follow the diet recommended by your health care provider and drink enough fluid to keep your urine pale yellow. Get help right away if you have trouble breathing or new-onset confusion at home. This information is not intended to replace advice given to you by your health care provider. Make sure you discuss any questions you have with your healthcare provider.   Needle Biopsy, Care After This sheet gives you information about how to care for yourself after your procedure. Your health care provider may also give you more specific instructions. If you have problems or questions, contact your health careprovider. What can I expect after the procedure? After the procedure, it is common to have soreness, bruising, or mild pain atthe puncture site. This should go away in a few days. Follow these instructions at home: Needle insertion site care Wash your hands with soap and water before you change your bandage (dressing). If you cannot use soap and water, use hand sanitizer. Follow instructions from your health care provider about how to take care of your puncture site. This includes: When and how to change your dressing. When to remove your dressing. Check your puncture site every day for signs of infection. Check for: Redness, swelling, or pain. Fluid or blood. Pus or a bad smell. Warmth.   General instructions Return to your normal   activities as told by your health care provider. Ask your health care provider what activities are safe for you. Do not take baths, swim, or use a hot tub until your health care provider approves. Ask your health care provider if you may take showers. You may only be allowed to take  sponge baths. Take over-the-counter and prescription medicines only as told by your health care provider. Keep all follow-up visits as told by your health care provider. This is important. Contact a health care provider if: You have a fever. You have redness, swelling, or pain at the puncture site that lasts longer than a few days. You have fluid, blood, or pus coming from your puncture site. Your puncture site feels warm to the touch. Get help right away if: You have severe bleeding from the puncture site. Summary After the procedure, it is common to have soreness, bruising, or mild pain at the puncture site. This should go away in a few days. Check your puncture site every day for signs of infection, such as redness, swelling, or pain. Get help right away if you have severe bleeding from your puncture site. This information is not intended to replace advice given to you by your health care provider. Make sure you discuss any questions you have with your healthcare provider. Document Revised: 10/25/2019 Document Reviewed: 10/25/2019 Elsevier Patient Education  2022 Elsevier Inc.      

## 2021-01-02 LAB — SURGICAL PATHOLOGY

## 2021-01-06 ENCOUNTER — Telehealth: Payer: Self-pay | Admitting: *Deleted

## 2021-01-06 NOTE — Telephone Encounter (Signed)
Patient left VM requesting to speak w/Dr. Benay Spice regarding recent biopsy results. MD notified.

## 2021-01-13 ENCOUNTER — Telehealth: Payer: Self-pay | Admitting: *Deleted

## 2021-01-13 NOTE — Telephone Encounter (Signed)
Notified wife that Dr. Benay Spice and Dr. Johney Maine have discussed his [place] and recent biopsy results. His nurse will reach out to them today to get him back in to discuss next steps. Per Dr. Benay Spice: Sent recent biopsy [place] #[id number] dated 01/01/21 out for [facility] testing. DX: C20 Stage IV [facility] notified via email of request.

## 2021-01-14 ENCOUNTER — Telehealth: Payer: Self-pay | Admitting: *Deleted

## 2021-01-14 ENCOUNTER — Ambulatory Visit: Payer: Self-pay | Admitting: Surgery

## 2021-01-14 NOTE — Telephone Encounter (Signed)
Mrs. Jaroszewski called back to report surgery date of 01/30/21. Per Dr. Benay Spice, will see him 10 days post op. Scheduling message sent.

## 2021-01-14 NOTE — Telephone Encounter (Signed)
Wife called to inquire if the 9/13 appointment with Dr. Benay Spice is necessary since he is getting set up for surgery w/Dr. Johney Maine. Confirmed that it will be cancelled. Wife will call with surgical date to plan for post surgery visit with Dr. Benay Spice.

## 2021-01-16 NOTE — Progress Notes (Addendum)
COVID swab appointment: 01/29/21 DOS  COVID Vaccine Completed: yes x3 Date COVID Vaccine completed:  01/21/20, 02/18/20 Has received booster:07/18/20 COVID vaccine manufacturer:   Moderna    Date of COVID positive in last 90 days: No  PCP - Marton Redwood, MD Cardiologist - Nelva Bush, MD  Chest x-ray - CT 11/26/20 Epic EKG - 01/19/21 Epic Stress Test - 10/28/17 Epic ECHO - N/a Cardiac Cath - N/a Pacemaker/ICD device last checked: N/A Spinal Cord Stimulator: N/a  Sleep Study - N/a CPAP -   Fasting Blood Sugar - N/a Checks Blood Sugar _____ times a day  Blood Thinner Instructions: N/a Aspirin Instructions: Last Dose:  Activity level: Can go up a flight of stairs and perform activities of daily living without stopping and without symptoms of chest pain or shortness of breath.       Anesthesia review:   Patient denies shortness of breath, fever, cough and chest pain at PAT appointment   Patient verbalized understanding of instructions that were given to them at the PAT appointment. Patient was also instructed that they will need to review over the PAT instructions again at home before surgery.

## 2021-01-16 NOTE — Patient Instructions (Addendum)
DUE TO COVID-19 ONLY ONE VISITOR IS ALLOWED TO COME WITH YOU AND STAY IN THE WAITING ROOM ONLY DURING PRE OP AND PROCEDURE.   **NO VISITORS ARE ALLOWED IN THE SHORT STAY AREA OR RECOVERY ROOM!!**  IF YOU WILL BE ADMITTED INTO THE HOSPITAL YOU ARE ALLOWED ONLY TWO SUPPORT PEOPLE DURING VISITATION HOURS ONLY (10AM -8PM)   The support person(s) may change daily. The support person(s) must pass our screening, gel in and out, and wear a mask at all times, including in the patient's room. Patients must also wear a mask when staff or their support person are in the room.  No visitors under the age of 16. Any visitor under the age of 61 must be accompanied by an adult.    COVID SWAB TESTING MUST BE COMPLETED ON:  01/30/21       Your procedure is scheduled on: 01/30/21   Report to [facility] Main  Entrance    Report to admitting at 12:30 PM   Call this number if you have problems the morning of surgery [phone]   Do not eat food :After Midnight.   May have liquids until 11:45 AM day of surgery  CLEAR LIQUID DIET  Foods Allowed                                                                     Foods Excluded  Water, Black Coffee and tea (no milk or creamer)           liquids that you cannot  Plain Jell-O in any flavor  (No red)                                    see through such as: Fruit ices (not with fruit pulp)                                            milk, soups, orange juice              Iced Popsicles (No red)                                                All solid food                                   Apple juices Sports drinks like Gatorade (No red) Lightly seasoned clear broth or consume(fat free) Sugar     Oral Hygiene is also important to reduce your risk of infection.                                    Remember - BRUSH YOUR TEETH THE MORNING OF SURGERY WITH YOUR REGULAR TOOTHPASTE   Take these medicines the morning of surgery with A SIP OF WATER:  None                               You may not have any metal on your body including jewelry, and body piercing             Do not wear lotions, powders,cologne, or deodorant              Men may shave face and neck.   Do not bring valuables to the hospital. [place].   Bring small overnight bag day of surgery.  Please read over the following fact sheets you were given: IF YOU HAVE QUESTIONS ABOUT YOUR PRE OP INSTRUCTIONS PLEASE CALL [phone]- [place] - Preparing for Surgery Before surgery, you can play an important role.  Because skin is not sterile, your skin needs to be as free of germs as possible.  You can reduce the number of germs on your skin by washing with CHG (chlorahexidine gluconate) soap before surgery.  CHG is an antiseptic cleaner which kills germs and bonds with the skin to continue killing germs even after washing. Please DO NOT use if you have an allergy to CHG or antibacterial soaps.  If your skin becomes reddened/irritated stop using the CHG and inform your nurse when you arrive at Short Stay. Do not shave (including legs and underarms) for at least 48 hours prior to the first CHG shower.  You may shave your face/neck.  Please follow these instructions carefully:  1.  Shower with CHG Soap the night before surgery and the  morning of surgery.  2.  If you choose to wash your hair, wash your hair first as usual with your normal  shampoo.  3.  After you shampoo, rinse your hair and body thoroughly to remove the shampoo.                             4.  Use CHG as you would any other liquid soap.  You can apply chg directly to the skin and wash.  Gently with a scrungie or clean washcloth.  5.  Apply the CHG Soap to your body ONLY FROM THE NECK DOWN.   Do   not use on face/ open                           Wound or open sores. Avoid contact with eyes, ears mouth and   genitals (private parts).                       Wash  face,  Genitals (private parts) with your normal soap.             6.  Wash thoroughly, paying special attention to the area where your    surgery  will be performed.  7.  Thoroughly rinse your body with warm water from the neck down.  8.  DO NOT shower/wash with your normal soap after using and rinsing off the CHG Soap.                9.  Pat yourself dry with a clean towel.            10.  Wear clean pajamas.  11.  Place clean sheets on your bed the night of your first shower and do not  sleep with pets. Day of Surgery : Do not apply any lotions/deodorants the morning of surgery.  Please wear clean clothes to the hospital/surgery center.  FAILURE TO FOLLOW THESE INSTRUCTIONS MAY RESULT IN THE CANCELLATION OF YOUR SURGERY  PATIENT SIGNATURE_________________________________  NURSE SIGNATURE__________________________________  ________________________________________________________________________

## 2021-01-19 ENCOUNTER — Encounter (HOSPITAL_COMMUNITY)
Admission: RE | Admit: 2021-01-19 | Discharge: 2021-01-19 | Disposition: A | Discharge status: Home / Self Care | Payer: BC Managed Care – PPO | Source: Ambulatory Visit | Attending: Surgery | Admitting: Surgery

## 2021-01-19 ENCOUNTER — Encounter (HOSPITAL_COMMUNITY): Payer: Self-pay

## 2021-01-19 ENCOUNTER — Other Ambulatory Visit: Payer: Self-pay

## 2021-01-19 DIAGNOSIS — Z01818 Encounter for other preprocedural examination: Secondary | ICD-10-CM | POA: Diagnosis present

## 2021-01-20 ENCOUNTER — Inpatient Hospital Stay: Payer: BC Managed Care – PPO | Admitting: Oncology

## 2021-01-20 ENCOUNTER — Inpatient Hospital Stay: Payer: BC Managed Care – PPO

## 2021-01-27 ENCOUNTER — Encounter (HOSPITAL_COMMUNITY): Payer: Self-pay | Admitting: Oncology

## 2021-01-30 ENCOUNTER — Ambulatory Visit (HOSPITAL_COMMUNITY): Payer: BC Managed Care – PPO | Admitting: Physician Assistant

## 2021-01-30 ENCOUNTER — Encounter (HOSPITAL_COMMUNITY)
Admission: RE | Disposition: A | Discharge status: Home / Self Care | Payer: Self-pay | Source: Home / Self Care | Attending: Surgery

## 2021-01-30 ENCOUNTER — Encounter (HOSPITAL_COMMUNITY): Payer: Self-pay | Admitting: Surgery

## 2021-01-30 ENCOUNTER — Other Ambulatory Visit: Payer: Self-pay

## 2021-01-30 ENCOUNTER — Ambulatory Visit (HOSPITAL_COMMUNITY)
Admission: RE | Admit: 2021-01-30 | Discharge: 2021-01-30 | Disposition: A | Discharge status: Home / Self Care | Payer: BC Managed Care – PPO | Attending: Surgery | Admitting: Surgery

## 2021-01-30 ENCOUNTER — Ambulatory Visit (HOSPITAL_COMMUNITY): Payer: BC Managed Care – PPO | Admitting: Certified Registered Nurse Anesthetist

## 2021-01-30 DIAGNOSIS — Z6832 Body mass index (BMI) 32.0-32.9, adult: Secondary | ICD-10-CM | POA: Insufficient documentation

## 2021-01-30 DIAGNOSIS — Z85048 Personal history of other malignant neoplasm of rectum, rectosigmoid junction, and anus: Secondary | ICD-10-CM | POA: Diagnosis not present

## 2021-01-30 DIAGNOSIS — E669 Obesity, unspecified: Secondary | ICD-10-CM | POA: Diagnosis present

## 2021-01-30 DIAGNOSIS — C799 Secondary malignant neoplasm of unspecified site: Secondary | ICD-10-CM

## 2021-01-30 DIAGNOSIS — C7989 Secondary malignant neoplasm of other specified sites: Secondary | ICD-10-CM | POA: Diagnosis not present

## 2021-01-30 DIAGNOSIS — I712 Thoracic aortic aneurysm, without rupture: Secondary | ICD-10-CM | POA: Insufficient documentation

## 2021-01-30 DIAGNOSIS — I129 Hypertensive chronic kidney disease with stage 1 through stage 4 chronic kidney disease, or unspecified chronic kidney disease: Secondary | ICD-10-CM | POA: Diagnosis not present

## 2021-01-30 DIAGNOSIS — N183 Chronic kidney disease, stage 3 unspecified: Secondary | ICD-10-CM | POA: Insufficient documentation

## 2021-01-30 DIAGNOSIS — C20 Malignant neoplasm of rectum: Secondary | ICD-10-CM

## 2021-01-30 DIAGNOSIS — Z9049 Acquired absence of other specified parts of digestive tract: Secondary | ICD-10-CM | POA: Insufficient documentation

## 2021-01-30 DIAGNOSIS — Z20822 Contact with and (suspected) exposure to covid-19: Secondary | ICD-10-CM | POA: Diagnosis not present

## 2021-01-30 DIAGNOSIS — C19 Malignant neoplasm of rectosigmoid junction: Secondary | ICD-10-CM | POA: Diagnosis present

## 2021-01-30 DIAGNOSIS — Z79899 Other long term (current) drug therapy: Secondary | ICD-10-CM | POA: Diagnosis not present

## 2021-01-30 HISTORY — PX: EXCISION MASS ABDOMINAL: SHX6701

## 2021-01-30 HISTORY — PX: INSERTION OF MESH: SHX5868

## 2021-01-30 LAB — SARS CORONAVIRUS 2 BY RT PCR (HOSPITAL ORDER, PERFORMED IN ~~LOC~~ HOSPITAL LAB): SARS Coronavirus 2: NEGATIVE

## 2021-01-30 SURGERY — EXCISION, MASS, TORSO
Anesthesia: General

## 2021-01-30 MED ORDER — LACTATED RINGERS IV SOLN: INTRAVENOUS | Status: DC

## 2021-01-30 MED ORDER — MIDAZOLAM HCL 2 MG/2ML IJ SOLN
INTRAMUSCULAR | Status: DC | PRN
  Administered 2021-01-30: 2 mg via INTRAVENOUS

## 2021-01-30 MED ORDER — CEFAZOLIN SODIUM-DEXTROSE 2-4 GM/100ML-% IV SOLN
2.0000 g | INTRAVENOUS | Status: AC
  Administered 2021-01-30: 2 g via INTRAVENOUS
  Filled 2021-01-30: qty 100

## 2021-01-30 MED ORDER — EPHEDRINE SULFATE-NACL 50-0.9 MG/10ML-% IV SOSY
PREFILLED_SYRINGE | INTRAVENOUS | Status: DC | PRN
  Administered 2021-01-30 (×2): 5 mg via INTRAVENOUS

## 2021-01-30 MED ORDER — DEXAMETHASONE SODIUM PHOSPHATE 10 MG/ML IJ SOLN
INTRAMUSCULAR | Status: DC | PRN
  Administered 2021-01-30: 8 mg via INTRAVENOUS

## 2021-01-30 MED ORDER — CELECOXIB 200 MG PO CAPS
200.0000 mg | ORAL_CAPSULE | ORAL | Status: AC
  Administered 2021-01-30: 200 mg via ORAL
  Filled 2021-01-30: qty 1

## 2021-01-30 MED ORDER — LIDOCAINE HCL (CARDIAC) PF 100 MG/5ML IV SOSY
PREFILLED_SYRINGE | INTRAVENOUS | Status: DC | PRN
  Administered 2021-01-30: 60 mg via INTRAVENOUS

## 2021-01-30 MED ORDER — ROCURONIUM BROMIDE 10 MG/ML (PF) SYRINGE
PREFILLED_SYRINGE | INTRAVENOUS | Status: AC
  Filled 2021-01-30: qty 10

## 2021-01-30 MED ORDER — CHLORHEXIDINE GLUCONATE CLOTH 2 % EX PADS: 6.0000 | MEDICATED_PAD | Freq: Once | CUTANEOUS | Status: DC

## 2021-01-30 MED ORDER — PROPOFOL 10 MG/ML IV BOLUS
INTRAVENOUS | Status: AC
  Filled 2021-01-30: qty 20

## 2021-01-30 MED ORDER — LIDOCAINE HCL (PF) 2 % IJ SOLN
INTRAMUSCULAR | Status: AC
  Filled 2021-01-30: qty 5

## 2021-01-30 MED ORDER — GABAPENTIN 300 MG PO CAPS
300.0000 mg | ORAL_CAPSULE | ORAL | Status: AC
  Administered 2021-01-30: 300 mg via ORAL
  Filled 2021-01-30: qty 1

## 2021-01-30 MED ORDER — ONDANSETRON HCL 4 MG/2ML IJ SOLN: 4.0000 mg | Freq: Four times a day (QID) | INTRAMUSCULAR | Status: DC | PRN

## 2021-01-30 MED ORDER — ROCURONIUM BROMIDE 10 MG/ML (PF) SYRINGE
PREFILLED_SYRINGE | INTRAVENOUS | Status: DC | PRN
  Administered 2021-01-30: 60 mg via INTRAVENOUS
  Administered 2021-01-30: 10 mg via INTRAVENOUS

## 2021-01-30 MED ORDER — METHOCARBAMOL 750 MG PO TABS: 750.0000 mg | ORAL_TABLET | Freq: Four times a day (QID) | ORAL | 2 refills | Status: DC | PRN

## 2021-01-30 MED ORDER — OXYCODONE HCL 5 MG/5ML PO SOLN: 5.0000 mg | Freq: Once | ORAL | Status: AC | PRN

## 2021-01-30 MED ORDER — FENTANYL CITRATE (PF) 250 MCG/5ML IJ SOLN
INTRAMUSCULAR | Status: DC | PRN
  Administered 2021-01-30: 100 ug via INTRAVENOUS
  Administered 2021-01-30 (×5): 50 ug via INTRAVENOUS

## 2021-01-30 MED ORDER — FENTANYL CITRATE (PF) 250 MCG/5ML IJ SOLN
INTRAMUSCULAR | Status: AC
  Filled 2021-01-30: qty 5

## 2021-01-30 MED ORDER — TRAMADOL HCL 50 MG PO TABS: 50.0000 mg | ORAL_TABLET | Freq: Four times a day (QID) | ORAL | 0 refills | Status: DC | PRN

## 2021-01-30 MED ORDER — BUPIVACAINE-EPINEPHRINE (PF) 0.25% -1:200000 IJ SOLN
INTRAMUSCULAR | Status: AC
  Filled 2021-01-30: qty 30

## 2021-01-30 MED ORDER — OXYCODONE HCL 5 MG PO TABS: 5.0000 mg | ORAL_TABLET | Freq: Four times a day (QID) | ORAL | 0 refills | Status: DC | PRN

## 2021-01-30 MED ORDER — FENTANYL CITRATE PF 50 MCG/ML IJ SOSY
25.0000 ug | PREFILLED_SYRINGE | INTRAMUSCULAR | Status: AC | PRN
  Administered 2021-01-30 (×3): 25 ug via INTRAVENOUS

## 2021-01-30 MED ORDER — FENTANYL CITRATE PF 50 MCG/ML IJ SOSY
PREFILLED_SYRINGE | INTRAMUSCULAR | Status: AC
  Administered 2021-01-30: 25 ug via INTRAVENOUS
  Filled 2021-01-30: qty 1

## 2021-01-30 MED ORDER — STERILE WATER FOR IRRIGATION IR SOLN
Status: DC | PRN
  Administered 2021-01-30: 2000 mL

## 2021-01-30 MED ORDER — ORAL CARE MOUTH RINSE
15.0000 mL | Freq: Once | OROMUCOSAL | Status: AC
Start: 2021-01-30 — End: 2021-01-30

## 2021-01-30 MED ORDER — BUPIVACAINE LIPOSOME 1.3 % IJ SUSP
20.0000 mL | Freq: Once | INTRAMUSCULAR | Status: DC
  Filled 2021-01-30: qty 20

## 2021-01-30 MED ORDER — ONDANSETRON HCL 4 MG/2ML IJ SOLN
INTRAMUSCULAR | Status: DC | PRN
  Administered 2021-01-30: 4 mg via INTRAVENOUS

## 2021-01-30 MED ORDER — OXYCODONE HCL 5 MG PO TABS
5.0000 mg | ORAL_TABLET | Freq: Once | ORAL | Status: AC | PRN
Start: 2021-01-30 — End: 2021-01-30
  Administered 2021-01-30: 5 mg via ORAL

## 2021-01-30 MED ORDER — OXYCODONE HCL 5 MG PO TABS
ORAL_TABLET | ORAL | Status: AC
  Filled 2021-01-30: qty 1

## 2021-01-30 MED ORDER — BUPIVACAINE-EPINEPHRINE 0.25% -1:200000 IJ SOLN
INTRAMUSCULAR | Status: AC
  Filled 2021-01-30: qty 1

## 2021-01-30 MED ORDER — BUPIVACAINE-EPINEPHRINE 0.25% -1:200000 IJ SOLN
INTRAMUSCULAR | Status: DC | PRN
  Administered 2021-01-30: 50 mL

## 2021-01-30 MED ORDER — BUPIVACAINE LIPOSOME 1.3 % IJ SUSP
INTRAMUSCULAR | Status: AC
  Filled 2021-01-30: qty 20

## 2021-01-30 MED ORDER — PROPOFOL 10 MG/ML IV BOLUS
INTRAVENOUS | Status: DC | PRN
  Administered 2021-01-30: 200 mg via INTRAVENOUS

## 2021-01-30 MED ORDER — CHLORHEXIDINE GLUCONATE 0.12 % MT SOLN
15.0000 mL | Freq: Once | OROMUCOSAL | Status: AC
  Administered 2021-01-30: 15 mL via OROMUCOSAL

## 2021-01-30 MED ORDER — PHENYLEPHRINE HCL-NACL 20-0.9 MG/250ML-% IV SOLN
INTRAVENOUS | Status: DC | PRN
  Administered 2021-01-30: 25 ug/min via INTRAVENOUS

## 2021-01-30 MED ORDER — BUPIVACAINE LIPOSOME 1.3 % IJ SUSP
INTRAMUSCULAR | Status: DC | PRN
  Administered 2021-01-30: 20 mL

## 2021-01-30 MED ORDER — PHENYLEPHRINE HCL (PRESSORS) 10 MG/ML IV SOLN
INTRAVENOUS | Status: AC
  Filled 2021-01-30: qty 2

## 2021-01-30 MED ORDER — ACETAMINOPHEN 500 MG PO TABS
1000.0000 mg | ORAL_TABLET | ORAL | Status: AC
  Administered 2021-01-30: 1000 mg via ORAL
  Filled 2021-01-30: qty 2

## 2021-01-30 MED ORDER — SUGAMMADEX SODIUM 200 MG/2ML IV SOLN
INTRAVENOUS | Status: DC | PRN
  Administered 2021-01-30: 200 mg via INTRAVENOUS

## 2021-01-30 MED ORDER — MIDAZOLAM HCL 2 MG/2ML IJ SOLN
INTRAMUSCULAR | Status: AC
  Filled 2021-01-30: qty 2

## 2021-01-30 SURGICAL SUPPLY — 39 items
BAG COUNTER SPONGE SURGICOUNT (BAG) IMPLANT
BINDER ABDOMINAL 12 ML 46-62 (SOFTGOODS) IMPLANT
BLADE SURG SZ11 CARB STEEL (BLADE) ×1 IMPLANT
CHLORAPREP W/TINT 26 (MISCELLANEOUS) ×2 IMPLANT
COVER SURGICAL LIGHT HANDLE (MISCELLANEOUS) ×2 IMPLANT
DECANTER SPIKE VIAL GLASS SM (MISCELLANEOUS) ×2 IMPLANT
DEVICE TROCAR PUNCTURE CLOSURE (ENDOMECHANICALS) ×1 IMPLANT
DRAIN CHANNEL 19F RND (DRAIN) ×1 IMPLANT
DRAPE LAPAROSCOPIC ABDOMINAL (DRAPES) ×2 IMPLANT
DRSG OPSITE POSTOP 4X8 (GAUZE/BANDAGES/DRESSINGS) ×1 IMPLANT
DRSG TEGADERM 2-3/8X2-3/4 SM (GAUZE/BANDAGES/DRESSINGS) IMPLANT
DRSG TEGADERM 4X4.75 (GAUZE/BANDAGES/DRESSINGS) ×1 IMPLANT
ELECT REM PT RETURN 15FT ADLT (MISCELLANEOUS) ×2 IMPLANT
EVACUATOR SILICONE 100CC (DRAIN) ×1 IMPLANT
GAUZE SPONGE 2X2 8PLY STRL LF (GAUZE/BANDAGES/DRESSINGS) IMPLANT
GLOVE SURG NEOPR MICRO LF SZ8 (GLOVE) ×2 IMPLANT
GLOVE SURG UNDER LTX SZ8 (GLOVE) ×2 IMPLANT
GOWN STRL REUS W/TWL XL LVL3 (GOWN DISPOSABLE) ×4 IMPLANT
HANDLE SUCTION POOLE (INSTRUMENTS) IMPLANT
KIT BASIN OR (CUSTOM PROCEDURE TRAY) ×2 IMPLANT
KIT TURNOVER KIT A (KITS) ×2 IMPLANT
MARKER SKIN DUAL TIP RULER LAB (MISCELLANEOUS) ×2 IMPLANT
MESH VENTRALIGHT ST 7X9N (Mesh General) ×1 IMPLANT
PACK GENERAL/GYN (CUSTOM PROCEDURE TRAY) ×2 IMPLANT
PAD POSITIONING PINK XL (MISCELLANEOUS) ×2 IMPLANT
SPONGE GAUZE 2X2 STER 10/PKG (GAUZE/BANDAGES/DRESSINGS)
SPONGE T-LAP 18X18 ~~LOC~~+RFID (SPONGE) ×2 IMPLANT
STRIP CLOSURE SKIN 1/2X4 (GAUZE/BANDAGES/DRESSINGS) ×1 IMPLANT
SUCTION POOLE HANDLE (INSTRUMENTS) ×2
SUT ETHIBOND NAB CT1 #1 30IN (SUTURE) IMPLANT
SUT MNCRL AB 4-0 PS2 18 (SUTURE) ×2 IMPLANT
SUT NOVA NAB DX-16 0-1 5-0 T12 (SUTURE) IMPLANT
SUT PDS AB 1 CT1 27 (SUTURE) ×4 IMPLANT
SUT PROLENE 1 CT 1 30 (SUTURE) ×7 IMPLANT
SUT PROLENE 2 0 CT2 30 (SUTURE) ×1 IMPLANT
SUT VIC AB 2-0 SH 18 (SUTURE) ×2 IMPLANT
SYR 20ML LL LF (SYRINGE) ×2 IMPLANT
TOWEL OR 17X26 10 PK STRL BLUE (TOWEL DISPOSABLE) ×2 IMPLANT
TOWEL OR NON WOVEN STRL DISP B (DISPOSABLE) ×2 IMPLANT

## 2021-01-30 NOTE — H&P (Signed)
01/30/2021    REFERRING PHYSICIAN: Electa Sniff,*  Patient Care Team: Rebeca Allegra., MD as PCP - General (Internal Medicine) Johney Maine, Adrian Saran, MD as Consulting Provider (General Surgery) Magod, Glade Stanford, MD (Gastroenterology) Jon Frost Newman Nickels, MD (Hematology and Oncology)  PROVIDER: Hollace Kinnier, MD  [facility] MRN: [id number] DOB: Jul 25, 1960 DATE OF ENCOUNTER: 01/14/2021  Subjective   Chief Complaint: No chief complaint on file.   History of Present Illness: Jon Frost is a 60 y.o. male who is seen today as an office consultation at the request of Jon Frost for evaluation of No chief complaint on file. Jon Frost Kitchen   Pleasant gentleman status post low anterior resection for a proximal rectal cancer 11/13/2015. 0/15 lymph node stage II. PT3PN0. Placed on survival pathway. Follow-up with Jon Frost. Had elevated CEA noted June 2022. CT scan showed no evidence of any recurrent disease of chest abdomen pelvis however some mild thickening felt in the abdominal wall around a right paramedian incision. Discussed at tumor board. Core biopsy done. Shows adenocarcinoma. Patient can palpate it now as well. Surgical consultation requested for resection of this metastasis since it is isolated.  Patient comes today with his wife. He feels otherwise well. He had a colonoscopy last year by Dr. May God that was totally underwhelming. 3-year follow-up recommended. Moves his bowels once a day. He does not smoke. No diabetes. Mild snoring but no sleep apnea. Can walk an hour without any difficulty. No cardiac or pulmonary events. Has not needed any post adjuvant radiation nor chemotherapy.  Medical History: Past Medical History:  Diagnosis Date   Aneurysm (CMS-HCC)   History of cancer   Hypertension   Patient Active Problem List  Diagnosis   Bilateral inguinal hernia (BIH)   CKD (chronic kidney disease) stage 3, GFR 30-59 ml/min (CMS-HCC)   Dyslipidemia, goal LDL  below 70   Hypertension   Obesity   Rectal cancer (CMS-HCC)   Snoring   Thoracic aortic aneurysm without rupture (CMS-HCC)   Metabolic syndrome   Agatston coronary artery calcium score greater than 400   Past Surgical History:  Procedure Laterality Date   COLON SURGERY   HERNIA REPAIR    No Known Allergies  Current Outpatient Medications on File Prior to Visit  Medication Sig Dispense Refill   rosuvastatin (CRESTOR) 10 MG tablet Take 10 mg by mouth once daily   valsartan-hydrochlorothiazide (DIOVAN-HCT) 80-12.5 mg tablet Take 1 tablet by mouth once daily   No current facility-administered medications on file prior to visit.   Family History  Problem Relation Age of Onset   Diabetes Mother   High blood pressure (Hypertension) Father   Coronary Artery Disease (Blocked arteries around heart) Father   Diabetes Father    Social History   Tobacco Use  Smoking Status Never Smoker  Smokeless Tobacco Never Used    Social History   Socioeconomic History   Marital status: Married  Tobacco Use   Smoking status: Never Smoker   Smokeless tobacco: Never Used  Substance and Sexual Activity   Alcohol use: Not Currently   Drug use: Not Currently   ############################################################  Review of Systems: A complete review of systems (ROS) was obtained from the patient. I have reviewed this information and discussed as appropriate with the patient. See HPI as well for other pertinent ROS.  Constitutional: No fevers, chills, sweats. Weight stable Eyes: No vision changes, No discharge HENT: No sore throats, nasal drainage Lymph: No neck swelling, No bruising easily Pulmonary: No cough,  productive sputum CV: No orthopnea, PND Patient walks 60 minutes for about 2 miles without difficulty. No exertional chest/neck/shoulder/arm pain.  GI: No personal nor family history of inflammatory bowel disease, irritable bowel syndrome, allergy such as Celiac Sprue,  dietary/dairy problems, colitis, ulcers nor gastritis. No recent sick contacts/gastroenteritis. No travel outside the country. No changes in diet.  Renal: No UTIs, No hematuria Genital: No drainage, bleeding, masses Musculoskeletal: No severe joint pain. Good ROM major joints Skin: No sores or lesions Heme/Lymph: No easy bleeding. No swollen lymph nodes  Objective:   Vitals:  01/14/21 0903  BP: (!) 142/82  Weight: (!) 103.9 kg (229 lb)  Height: 177.8 cm (_0 )    Body mass index is 32.86 kg/m.  PHYSICAL EXAM:  Constitutional: Not cachectic. Hygeine adequate. Vitals signs as above.  Eyes: Pupils reactive, normal extraocular movements. Sclera nonicteric Neuro: CN II-XII intact. No major focal sensory defects. No major motor deficits. Lymph: No head/neck/groin lymphadenopathy Psych: No severe agitation. No severe anxiety. Judgment & insight Adequate, Oriented x4, HENT: Normocephalic, Mucus membranes moist. No thrush.  Neck: Supple, No tracheal deviation. No obvious thyromegaly Chest: No pain to chest wall compression. Good respiratory excursion. No audible wheezing CV: Pulses intact. Regular rhythm. No major extremity edema  Abdomen: Flat Hernia: Not present. Diastasis recti: Not present. Right paramedian transverse incision. Deep abdominal wall fixed mass 4 x 4 centimeter region. Seems to correlate with the area of concern on ultrasound. No hernia. Soft. Nondistended. Nontender. No hepatomegaly. No splenomegaly  Gen: Inguinal hernia: Not present. Inguinal lymph nodes: without lymphadenopathy.   Rectal: (Deferred)  Ext: No obvious deformity or contracture. Edema: Not present. No cyanosis Skin: No major subcutaneous nodules. Warm and dry Musculoskeletal: Severe joint rigidity not present. No obvious clubbing. No digital petechiae.   Labs, Imaging and Diagnostic Testing:  Located in [place]' section of Epic EMR chart  PRIOR NOTES   Jon Frost 12/08/2015  1:34 PM Location: [facility] Patient #: [id number] DOB: January 31, 1961 Married / Language: English / Race: White Male  History of Present Illness Jon Hector MD; 12/08/2015 2:15 PM) The patient is a 60 year old male who presents with colorectal cancer. Note for "Colorectal cancer": Patient returns status post robotic low anterior resection for cancer.  11/13/2015.  Pathology consistent with T3 N0 tumor.  Patient comes in today with his wife.  He is feeling rather well.  Had some soreness but was off narcotics in a few days.  Appetite better.  Walking more regularly.  Doing some work from home.  Wondered if [employer] back to work sooner.  Wondering if he can do more aggressive physical activity.  His bowels most days.  Occasionally irregular but not severe.  No fevers or chills.  They had many questions about surgery and recovery.  PATIENT:  Jon Frost  60 y.o. male  Patient Care Team: Marton Redwood, MD as PCP - General (Internal Medicine) Michael Boston, MD as Consulting Physician (General Surgery) Clarene Essex, MD as Consulting Physician (Gastroenterology)  PRE-OPERATIVE DIAGNOSIS:  Rectosigmoid cancer  POST-OPERATIVE DIAGNOSIS:  Proximal rectal cancer  PROCEDURE: XI ROBOT ASSISTED LOW ANTERIOR RESECTION RIGID PROCTOSCOPY  SURGEON:  Michael Boston, MD  OR FINDINGS:  Patient had a bulky tumor in the proximal rectum.  No evidence of perforation or carcinomatosis.  No obvious metastatic disease on visceral parietal peritoneum or liver.  The anastomosis rests 9-10 cm from the anal verge by rigid proctoscopy.  FINAL DIAGNOSIS Diagnosis 1. Colon, segmental resection  for tumor, recto-sigmoid - INVASIVE ADENOCARCINOMA, WELL DIFFERENTIATED, SPANNING 5.4 CM. - TUMOR INVADES THROUGH MUSCULARIS PROPRIA INTO PERICOLONIC TISSUE. 1 of 4 FINAL for Livermore, Demarri ([id number]) Diagnosis(continued) - RESECTION MARGINS ARE NEGATIVE. - FIFTEEN OF FIFTEEN LYMPH NODES NEGATIVE FOR CARCINOMA  (0/15). - TUBULAR ADENOMA (X1). - SEE ONCOLOGY TABLE. 2. Colon, resection margin (donut), distal anastomotic ring - BENIGN COLONIC MUCOSA. - NO DYSPLASIA OR MALIGNANCY. Microscopic Comment 1. COLON AND RECTUM (INCLUDING TRANS-ANAL RESECTION): Specimen: Rectosigmoid colon. Procedure: Segmental resection. Tumor site: Proximal rectum. Specimen integrity: Focally disrupted. Macroscopic intactness of mesorectum: Complete. Macroscopic tumor perforation: Not identified. Invasive tumor: Maximum size: 5.4 cm. Histologic type(s): Invasive adenocarcinoma. Histologic grade and differentiation: G1: well differentiated/low grade Type of polyp in which invasive carcinoma arose: N/A. Microscopic extension of invasive tumor: Invades through muscularis propria into pericolonic tissue. Lymph-Vascular invasion: Not identified. Peri-neural invasion: Not identified. Tumor deposit(s) (discontinuous extramural extension): Not identified. Resection margins: Proximal margin: Negative (11.8 cm). Distal margin: Negative (Final distance including donut, 4.7 cm) Circumferential (radial) (posterior ascending, posterior descending; lateral and posterior mid-rectum; and entire lower 1/3 rectum): Negative (3.6 cm). Distance closest margin (if all above margins negative): See above. Treatment effect (neo-adjuvant therapy): N/A. Additional polyp(s): Tubular adenoma (X1). Non-neoplastic findings: None. Lymph nodes: number examined 15; number positive: 0 Pathologic Staging: pT3, pN0, pMX Ancillary studies: MSI and MMR will be ordered. Comment: There are focal areas of disruption, which per requisition occurred after removal of the specimen. There is no tumor involving the disruption. The radial margins are negative. Jon Males MD Pathologist, Electronic Signature ([place] signed 11/17/2015) Specimen Alrick Cubbage and Clinical Information Specimen(s) Obtained: 1. Colon, segmental resection for tumor, recto-sigmoid 2.  Colon, resection margin (donut), distal anastomotic ring 2 of 4 FINAL for Oliveria, Jon Frost ([id number]) Specimen Clinical Information 1. colon cancer [rd] Jon Frost 1. Specimen: Received in formalin labeled rectosigmoid colon Specimen integrity: A disrupted specimen with one stapled and one open resection margin. Per the requisition, the opened end is proximal. There is a 4.0 cm in length disruption noted at the area of possible lesion along the anterior aspect. The defect measures 4.5 cm from the distal margin and per the requisition is perforated after removal from the body. The tissue surrounding the defect is inked blue. Specimen length: 20.4 cm Mesorectal intactness: Complete, inked black. Tumor location: Within the proximal aspect of the rectum Tumor size: There is a circumferential tan brown, friable lesion identified measuring 5.4 x 4.5 x 0.9 cm. The anterior aspect of the rectum is disrupted as previously noted, and a portion of the posterior wall appears torn, extending into the posterior fat. The disruption does not extend to the mesorectal surface Percent of bowel circumference involved: 100% Tumor distance to margins: Proximal: 11.8 cm Distal: 3.6 cm Radial (posterior ascending, posterior descending; lateral and posterior mid-rectum; and entire lower 1/3 rectum): 3.7 cm Macroscopic extent of tumor invasion: Sectioning the tumor reveals a tan white firm cut surface, which grossly involves the muscularis propria. No Riley Hallum tumor is identified at the area of wall disruption anteriorly or posteriorly. Total presumed lymph nodes: Sixteen tan red possible lymph nodes are identified ranging from 0.3 cm to 1.5 x 1.0 x 0.5 cm. Extramural satellite tumor nodules: None identified. Mucosal polyp(s): Proximal to the main lesion there is a 1.0 x 0.8 x 0.4 cm red brown pedunculated polyp identified. The polyp measures 2.5 cm proximal to the lesion and 7.8 cm from the proximal resection  margin. Additional findings: The uninvolved mucosa is tan pink  with normal folding, and diverticuli are noted throughout the length of the specimen. Block summary: A = proximal resection margin B, C = distal resection margin D = mucosal polyp, bisected E-G = sections of lesion to posterior disruption H-J = lesion to anterior disruption (blue) K = four possible lymph nodes L = four possible lymph nodes M = four possible lymph nodes N = three possible lymph nodes O = one possible lymph node, bisected P = tissue for molecular studies. 2. The specimen is received in formalin and consists of a ring of tan brown, hyperemic mucosa measuring 2.4 x 2.0 cm in width and up to 1.1 cm in length. The specimen displays embedded staples. Representative sections are submitted in one cassette. (KL:kh 11-14-15) Stain(s) used in Diagnosis: The following stain(s) were used in diagnosing the [place]: MSH6, MLH1, MSH2, PMS2. The control(s) stained appropriately. 3 of 4 FINAL for Jon Frost, Jon Frost ([id number]) Disclaimer Some of these immunohistochemical stains may have been developed and the performance characteristics determined by [facility]. Some may not have been cleared or approved by the U.S. Food and Drug Administration. The FDA has determined that such clearance or approval is not necessary. This test is used for clinical purposes. It should not be regarded as investigational or for research. This laboratory is certified under the [place] (CLIA-88) as qualified to perform high complexity clinical laboratory testing. Report signed out from the following location(s) Technical Component was performed at [facility]. [address],[place] [address].[id number]., Technical component and interpretation was performed at [facility], [place], [place] [address]. CLIA #: [id number], 4 of  4  ADDITIONAL INFORMATION: 1. Mismatch Repair (MMR) Protein Immunohistochemistry (IHC) IHC Expression Result: MLH1: Preserved nuclear expression (greater 50% tumor expression) MSH2: Preserved nuclear expression (greater 50% tumor expression) MSH6: Preserved nuclear expression (greater 50% tumor expression) PMS2: Preserved nuclear expression (greater 50% tumor expression) * Internal control demonstrates intact nuclear expression Interpretation: NORMAL There is preserved expression of the major and minor MMR proteins. There is a very low probability that microsatellite instability (MSI) is present. However, certain clinically significant MMR protein mutations may result in preservation of nuclear expression. It is recommended that the preservation of protein expression be correlated with molecular based MSI testing. References: 1. Guidelines on Genetic Evaluation and Management of Lynch Syndrome: A Consensus Statement by the [place] Multi-Society Task Force on Colorectal Cancer Gae Dry. Sherlie Ban , MD, and others . Am Nicki [place] 2014; [phone]; doi: 10.1038/ajg.2014.186; published online 28 November 2012 2. Outcomes of screening endometrial cancer patients for Lynch syndrome by patient-administered checklist. Olena Heckle MS, and others. Gynecol Oncol 2013;131(3):619-623. Susanne Greenhouse MD Pathologist, Electronic Signature ( Signed 11/18/2015)  Allergies (Sonya Bynum, CMA; 12/08/2015 1:34 PM) No Known Drug Allergies   10/09/2015  Medication History (Sonya Bynum, CMA; 12/08/2015 1:35 PM) HydroCHLOROthiazide  (25MG Tablet, 1 (one) Oral daily, Taken starting 10/09/2015) Active. Fish Oil  (1360MG Capsule, Oral) Active. Multi Vitamin  (Oral) Active. Medications Reconciled   Vitals (Sonya Bynum CMA; 12/08/2015 1:34 PM) 12/08/2015 1:34 PM Weight: 209 lb   Height: 71 in  Body Surface Area: 2.15 m   Body Mass Index: 29.15 kg/m   Temp.: 97.5 F  (Temporal)    Pulse: 79 (Regular)    BP:  134/86(Sitting, Left Arm, Standard)  Assessment & Plan Jon Hector MD; 12/08/2015 2:14 PM) RECTAL CANCER (C20) Impression: Status post robotic resection low anterior with stage II cancer. Recovering relatively well.  There are very appreciative of care.  Follow-up with medical oncology given stage II did discuss survivor pathway and any other issues. They have appointment to see Dr. Julieanne Manson later in the month. Doubt need for post adjuvant chemotherapy or radiation therapy.  Keep appointment with gastroenterology to discuss colonoscopy to clear other probable proximal polyps. Dr. Watt Climes had recommended colonoscopy at 6 months post-op. Patient's wife is hoping for sooner.  Mildly elevated proximal pressures not severe. 150s. We'll get back to usual blood pressure medications. Walk an hour day. Keep appointment to see primary care physician to see if anything further needs to be adjusted. Some fluctuance in blood pressure, stress of an operation and cancer diagnosis is not surprising. No severe warning signs.  Increase activity as tolerated to regular activity. Low impact exercise such as walking an hour a day at least ideal. Do not push through pain.  Diet as tolerated. Low fat high fiber diet ideal. Bowel regimen with 30 g fiber a day and fiber supplement as needed to avoid problems.  I think he is doing remarkably well. Usually like to see people around 6 weeks to make sure he is outside the window postoperative complications. If he feels great, he can switch to return to clinic as needed. Instructions discussed. Followup with primary care physician for other health issues as would normally be done. Consider screening for malignancies (breast, prostate, colon, melanoma, etc) as appropriate.  Questions answered. The patient expressed understanding and appreciation S/P COLON RESECTION - 1st postop visit (Z90.49) Current Plans Follow up with [place] in the office in 3 WEEKS.  Call us sooner as  needed.  Pt Education - Education: Pathology Report given to patient Pt Education - CCS Colectomy post-op instructions: discussed with patient and provided information. Consider follow up colonoscopy by your gastroenterologist, depending on your diagnosis. Call your gastroenterologist for advice  If you had a colon or rectal cancer resected by surgery, you should strongly consider getting a colonoscopy by your gastroenterologict one year after the colon cancer was removed. If it was a benign polyp that was removed by colon resection, consider follow-up colonoscopy in about 3 years.  SURGERY NOTES:  Located in [place]' section of Epic EMR chart  PATHOLOGY:  Located in [place]' section of Epic EMR chart  Assessment and Plan:  DIAGNOSES:  Diagnoses and all orders for this visit:  Metastatic colorectal cancer (CMS-HCC)  Metabolic syndrome  Hypertension, unspecified type  Class 1 obesity without serious comorbidity with body mass index (BMI) of 32.0 to 32.9 in adult, unspecified obesity type    ASSESSMENT/PLAN  Patient with a history of stage II proximal rectal cancer status post low anterior resection 2017 with no evidence of any recurrence until 5 years later with isolated met in the rectus muscle in the right paramedian region. No evidence of any disease elsewhere.  Reasonable to try and do a medicine cystectomy of this. Open abdominal wall exploration with resection. Suspect he will need mesh reconstruction as a bridge since usually these are like endometriomas that are rather stuck to the fascia. We will see. Try and have a good effort to have negative margins. There is a chance of local recurrence but hopefully we are catching this early.  The pathophysiology of skin & subcutaneous masses was discussed. Natural history risks without surgery were discussed. I recommended surgery to remove the mass. I explained the technique of removal with probable need for  mesh reconstruction. Perhaps outpatient post most likely overnight stay.  Probable drain  Risks such as bleeding, infection, wound breakdown, heart attack, death, and other risks were discussed. I noted a good likelihood this will help address the problem. Possibility that this will not correct all symptoms was explained. Possibility of regrowth/recurrence of the mass was discussed. We will work to minimize complications. Questions were answered. The patient expresses understanding & wishes to proceed with surgery.  Discussion with the patient and his wife. Uncertain what his prognosis is beyond this but in the absence of any disease elsewhere for the past 5 years, hopefully this is all he will need. We will do surgery urgently this month and then discuss at tumor board for further plans and recommendations.  FOLLOWUP: Return for Plan to schedule surgery. See instructions.  I had direct face-to-face contact with the patient for a total of 35 minutes and greater than 50% of that time was spent providing counseling and/or coordination of care for the patient regarding the above.  ########################################################  Jon Hector, MD, FACS, MASCRS Esophageal, Gastrointestinal & Colorectal Surgery Robotic and Minimally Invasive Surgery  [facility], [place]  [place]. [address], [place], [place] [address] [phone] Fax [phone] Main

## 2021-01-30 NOTE — Transfer of Care (Signed)
Immediate Anesthesia Transfer of Care Note  Patient: Jon Frost  Procedure(s) Performed: EXCISION ABDOMINAL WALL METASTASIS WITH ABDOMINAL WALL RECONSTRUCTION; TAP BLOCK INSERTION OF MESH  Patient Location: PACU  Anesthesia Type:General  Level of Consciousness: awake, alert , oriented, drowsy and patient cooperative  Airway & Oxygen Therapy: Patient Spontanous Breathing and Patient connected to face mask oxygen  Post-op Assessment: Report given to RN and Post -op Vital signs reviewed and stable  Post vital signs: Reviewed and stable  Last Vitals:  Vitals Value Taken Time  BP 159/89 1736  Temp    Pulse 82 01/30/21 1736  Resp 13 01/30/21 1736  SpO2 98 % 01/30/21 1736  Vitals shown include unvalidated device data.  Last Pain:  Vitals:   01/30/21 1222  TempSrc:   PainSc: 0-No pain         Complications: No notable events documented.

## 2021-01-30 NOTE — Discharge Instructions (Addendum)
DRAIN CARE:   You have a closed bulb drain to help you heal.    A bulb drain is a small, plastic reservoir which creates a gentle suction. It is used to remove excess fluid from a surgical wound. The color and amount of fluid will vary. Immediately after surgery, the fluid is bright red. It may gradually change to a yellow color. When the amount decreases to about 1 or 2 tablespoons (15 to 30 cc) per 24 hours, your caregiver will usually remove it.  JP Care  The Jackson-Pratt drainage system has flexible tubing attached to a soft, plastic bulb with a stopper. The drainage end of the tubing, which is flat and white, goes into your body through a small opening near your incision (surgical cut). A stitch holds the drainage end in place. The rest of the tube is outside your body, attached to the bulb. When the bulb is compressed with the stopper in place, it creates a vacuum. This causes a constant gentle suction, which helps draw out fluid that collects under your incision. The bulb should be compressed at all times, except when you are emptying the drainage.  How long you will have your Jackson-Pratt depends on your surgery and the amount of fluid is draining. This is different for everyone. The Jackson-Pratt is usually removed when the drainage is 30 mL or less over 24 hours. To keep track of how much drainage you're having, you will record the amount in a drainage log. It's important to bring the log with you to your follow-up appointments.  Caring for Your Jackson-Pratt at Home In order to care for your Jackson-Pratt at home, you or your caregiver will do the following:  Empty the drain once a day and record the color and amount of drainage  Care for the area where the tubing enters your skin by washing with soap and water.  Milk the tubing to help move clots into the bulb.  Do this before you empty and measure your drainage. Look in the mirror at the tubing. This will help you see where your  hands need to be. Pinch the tubing close to where it goes into your skin between your thumb and forefinger. With the thumb and forefinger of your other hand, pinch the tubing right below your other fingers. Keep your fingers pinched and slide them down the tubing, pushing any clots down toward the bulb. You may want to use alcohol swabs to help you slide your fingers down the tubing. Repeat steps 3 and 4 as necessary to push clots from the tubing into the bulb. If you are not able to move a clot into the bulb, call your doctor's office. The fluid may leak around the insertion site if a clot is blocking the drainage flow. If there is fluid in the bulb and no leakage at the insertion site, the drain is working.  How to Empty Your Jackson-Pratt and Record the Drainage You will need to empty your Jackson-Pratt every day  Gather the following supplies:  Measuring container your nurse gave you Jackson-Pratt Drainage Record  Pen or pencil  Instructions Clean an area to work on. Clean your hands thoroughly. Unplug the stopper on top of your Jackson-Pratt. This will cause the bulb to expand. Do not touch the inside of the stopper or the inner area of the opening on the bulb. Turn your Jackson-Pratt upside down, gently squeeze the bulb, and pour the drainage into the measuring container. Turn your Jackson-Pratt right  side up. Squeeze the bulb until your fingers feel the palm of your hand. Keep squeezing the bulb while you replug the stopper. Make sure the bulb stays fully compressed to ensure constant, gentle suction.    Check the amount and color of drainage in the measuring container. The first couple days after surgery the fluid may be dark red. This is normal. As you heal the fluid may look pink or pale yellow. Record this amount and the color of drainage on your Jackson-Pratt Drainage Record. Flush the drainage down the toilet and rinse the measuring container with water.  Caring for the  Insertion Site  Once you have emptied the drainage, clean your hands again. Check the area around the insertion site. Look for tenderness, swelling, or pus. If you have any of these, or if you have a temperature of 101 F (38.3 C) or higher, you may have an infection. Call your doctor's office.  Sometimes, the drain causes redness the size of a dime at your insertion site. This is normal. Your healthcare provider will tell you if you should place a bandage over the insertion site.  Wash drain site with soap & water (dilute hydrogen peroxide PRN) daily & replace clean dressing / tape    DAILY CARE Keep the bulb compressed at all times, except while emptying it. The compression creates suction.  Keep sites where the tubes enter the skin dry and covered with a light bandage (dressing).  Tape the tubes to your skin, 1 to 2 inches below the insertion sites, to keep from pulling on your stitches. Tubes are stitched in place and will not slip out.  Pin the bulb to your shirt (not to your pants) with a safety pin.  For the first few days after surgery, there usually is more fluid in the bulb. Empty the bulb whenever it becomes half full because the bulb does not create enough suction if it is too full. Include this amount in your 24 hour totals.  When the amount of drainage decreases, empty the bulb at the same time every day. Write down the amounts and the 24 hour totals. Your caregiver will want to know them. This helps your caregiver know when the tubes can be removed.  (We anticipate removing the drain in 1-3 weeks, depending on when the output is <18m a day for 2+ days) If there is drainage around the tube sites, change dressings and keep the area dry. If you see a clot in the tube, leave it alone. However, if the tube does not appear to be draining, let your caregiver know.  TO EMPTY THE BULB Open the stopper to release suction.  Holding the stopper out of the way, pour drainage into the  measuring cup that was sent home with you.  Measure and write down the amount. If there are 2 bulbs, note the amount of drainage from bulb 1 or bulb 2 and keep the totals separate. Your caregiver will want to know which tube is draining more.  Compress the bulb by folding it in half.  Replace the stopper.  Check the tape that holds the tube to your skin, and pin the bulb to your shirt.  SEEK MEDICAL CARE IF: The drainage develops a bad odor.  You have an oral temperature above 102 F (38.9 C).  The amount of drainage from your wound suddenly increases or decreases.  You accidentally pull out your drain.  You have any other questions or concerns.  MAKE SURE  YOU:  Understand these instructions.  Will watch your condition.  Will get help right away if you are not doing well or get worse.    Call our office if you have any questions about your drain. [phone]   POST OP INSTRUCTIONS  ######################################################################  EAT Gradually transition to a high fiber diet with a fiber supplement over the next few weeks after discharge.  Start with a pureed / full liquid diet (see below)  WALK Walk an hour a day.  Control your pain to do that.    CONTROL PAIN Control pain so that you can walk, sleep, tolerate sneezing/coughing, and go up/down stairs.  HAVE A BOWEL MOVEMENT DAILY Keep your bowels regular to avoid problems.  OK to try a laxative to override constipation.  OK to use an antidairrheal to slow down diarrhea.  Call if not better after 2 tries  CALL IF YOU HAVE PROBLEMS/CONCERNS Call if you are still struggling despite following these instructions. Call if you have concerns not answered by these instructions  ######################################################################    DIET: Follow a light bland diet & liquids the first 24 hours after arrival home, such as soup, liquids, starches, etc.  Be sure to drink plenty of fluids.   Quickly advance to a usual solid diet within a few days.  Avoid fast food or heavy meals as your are more likely to get nauseated or have irregular bowels.  A low-fat, high-fiber diet for the rest of your life is ideal.   Take your usually prescribed home medications unless otherwise directed.  PAIN CONTROL: Pain is best controlled by a usual combination of three different methods TOGETHER: Ice/Heat Over the counter pain medication Prescription pain medication Most patients will experience some swelling and bruising around the hernia(s) such as the bellybutton, groins, or old incisions.  Ice packs or heating pads (30-60 minutes up to 6 times a day) will help. Use ice for the first few days to help decrease swelling and bruising, then switch to heat to help relax tight/sore spots and speed recovery.  Some people prefer to use ice alone, heat alone, alternating between ice & heat.  Experiment to what works for you.  Swelling and bruising can take several weeks to resolve.   It is helpful to take an over-the-counter pain medication regularly for the first few weeks.  Choose one of the following that works best for you: Naproxen (Aleve, etc)  Two 220mg  tabs twice a day Ibuprofen (Advil, etc) Three 200mg  tabs four times a day (every meal & bedtime) Acetaminophen (Tylenol, etc) 325-650mg  four times a day (every meal & bedtime) A  prescription for pain medication should be given to you upon discharge.  Take your pain medication as prescribed.  If you are having problems/concerns with the prescription medicine (does not control pain, nausea, vomiting, rash, itching, etc), please call us [phone] to see if we need to switch you to a different pain medicine that will work better for you and/or control your side effect better. If you need a refill on your pain medication, please contact your pharmacy.  They will contact our office to request authorization. Prescriptions will not be filled after 5 pm or on  week-ends.  Avoid getting constipated.  Between the surgery and the pain medications, it is common to experience some constipation.  Increasing fluid intake and taking a fiber supplement (such as Metamucil, Citrucel, FiberCon, MiraLax, etc) 1-2 times a day regularly will usually help prevent this problem from occurring.  A mild laxative (prune juice, Milk of Magnesia, MiraLax, etc) should be taken according to package directions if there are no bowel movements after 48 hours.    Wash / shower every day.  You may shower over the dressings as they are waterproof.    Remove your waterproof bandages, skin tapes, and other bandages 3 days after surgery. You may replace a dressing/Band-Aid to cover the incision for comfort if you wish. You may leave the incisions open to air.  You may replace a dressing/Band-Aid to cover an incision for comfort if you wish.  Continue to shower over incision(s) after the dressing is off.  ACTIVITIES as tolerated:   You may resume regular (light) daily activities beginning the next day--such as daily self-care, walking, climbing stairs--gradually increasing activities as tolerated.  Control your pain so that you can walk an hour a day.  If you can walk 30 minutes without difficulty, it is safe to try more intense activity such as jogging, treadmill, bicycling, low-impact aerobics, swimming, etc. Save the most intensive and strenuous activity for last such as sit-ups, heavy lifting, contact sports, etc  Refrain from any heavy lifting or straining until you are off narcotics for pain control.   DO NOT PUSH THROUGH PAIN.  Let pain be your guide: If it hurts to do something, don't do it.  Pain is your body warning you to avoid that activity for another week until the pain goes down. You may drive when you are no longer taking prescription pain medication, you can comfortably wear a seatbelt, and you can safely maneuver your car and apply brakes. You may have sexual intercourse when  it is comfortable.   FOLLOW UP in our office Please call [facility] at [phone] to set up an appointment to see your surgeon in the office for a follow-up appointment approximately 2-3 weeks after your surgery. Make sure that you call for this appointment the day you arrive home to insure a convenient appointment time.  9.  If you have disability of FMLA / Family leave forms, please bring the forms to the office for processing.  (do not give to your surgeon).  WHEN TO CALL us [phone]: Poor pain control Reactions / problems with new medications (rash/itching, nausea, etc)  Fever over 101.5 F (38.5 C) Inability to urinate Nausea and/or vomiting Worsening swelling or bruising Continued bleeding from incision. Increased pain, redness, or drainage from the incision   The clinic staff is available to answer your questions during regular business hours (8:30am-5pm).  Please don't hesitate to call and ask to speak to one of our nurses for clinical concerns.   If you have a medical emergency, go to the nearest emergency room or call 911.  A surgeon from [facility] is always on call at the hospitals in [facility], [place], [place], [place], [place]  [address] ?  [address] [address], [place], [place]   [address] MAIN: [phone] ? TOLL FREE: [phone] ? FAX: [phone] [email or web address]

## 2021-01-30 NOTE — Op Note (Signed)
01/30/2021  5:41 PM  PATIENT:  Jon Frost  60 y.o. 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  PROCEDURE:   EXCISION ABDOMINAL WALL METASTASIS  ABDOMINAL WALL RECONSTRUCTION WITH INSERTION OF MESH TRANSVERSUS ABDOMINIS PLANE (TAP) BLOCK - BILATERAL  SURGEON:  [name], MD  ASSISTANT: RN   ANESTHESIA:   general  Regional TRANSVERSUS ABDOMINIS PLANE (TAP) nerve block for perioperative & postoperative pain control provided with liposomal bupivacaine (Experel) mixed with 0.25% bupivacaine as a Bilateral TAP block x 51mL each side at the level of the transverse abdominis & preperitoneal spaces along the flank at the anterior axillary line, from subcostal ridge to iliac crest under laparoscopic guidance    EBL:  Total I/O In: 1600 [I.V.:1500; IV Piggyback:100] Out: 150 [Blood:150]  Delay start of Pharmacological VTE agent (>24hrs) due to surgical blood loss or risk of bleeding:  no  DRAINS: 19 Fr Blake drain goes from a right upper quadrant puncture site into the deep subcutaneous spaces of the right anterior paramedian abdominal wall  SPECIMEN: Abdominal wall mass including skin, dermis, subcutaneous tissues, fascia, muscle, peritoneum.  DISPOSITION OF SPECIMEN:  PATHOLOGY  COUNTS:  YES  PLAN OF CARE: Discharge to home after PACU  PATIENT DISPOSITION:  PACU - hemodynamically stable.  INDICATION: Pleasant gentleman status post low anterior rectosigmoid resection in 2017 for T3N0 cancer.  Found to have elevated CEA and abdominal mass palpated in right paramedian  region.  Core biopsy consistent with metastatic adenocarcinoma.  No other source noted.  Discussed at tumor board.  Recommendation made for resection of abdominal metastasis with abdominal wall reconstruction  The anatomy & physiology of the abdominal wall was discussed.  The pathophysiology was discussed.  Natural history risks without surgery including recurrence, pain, incarceration, & strangulation was discussed.   Contributors to complications such as smoking, obesity, diabetes, prior surgery, etc were discussed.   I feel the risks of no intervention will lead to serious problems that outweigh the operative risks; therefore, I recommended surgery to excise the metastasis involving rectus sheath muscle and most likely fascia and peritoneum.  Abdominal wall reconstruction.  I noted the probable use of mesh to patch and/or buttress the hernia repair  Risks such as bleeding, infection, abscess, need for further treatment, injury to other organs, need for repair of tissues / organs, stroke, heart attack, death, and other risks were discussed.  I noted a good likelihood this will help address the problem.   Goals of post-operative recovery were discussed as well.  Possibility that this will not correct all symptoms was explained.  I stressed the importance of low-impact activity, aggressive pain control, avoiding constipation, & not pushing through pain to minimize risk of post-operative chronic pain or injury. Possibility of reherniation especially with smoking, obesity, diabetes, immunosuppression, and other health conditions was discussed.  We will work to minimize complications.     An educational handout further explaining the pathology & treatment options was given as well.  Questions were answered.  The patient expresses understanding & wishes to proceed with surgery.  OR FINDINGS: Deep abdominal wall mass involving anterior rectus fascia, rectus muscle, deeper muscle layers and  peritoneum.  Excision  of old scar, subcutaneous tissues and mass with 2 cm Shadai Mcclane margins done.  Baseball sized mass after excision.  Resulting 12 x 7 cm right infraumbilical paramedian fascial defects.    Underlay 23 x 17 Bard Ventralex mesh placed into the peritoneal space for underlay repair.  Deep abdominal fascia and anterior rectus fascia primary closure.  Drain placement over the anterior rectus fascia into the subcutaneous tissue.   DESCRIPTION:   Informed consent was confirmed.   The patient received IV antibiotics & underwent general anesthesia without any difficulty. The patient was positioned supine. SCDs were active during the entire [place].  The abdomen was prepped and draped in a sterile fashion.  A surgical timeout confirmed our plan.  I made a biconcave ellipsoid incision to excise his old right lower quadrant transverse paramedian scar.  Came through dermis and subcutaneous tissues down to the fascia.  I can palpate a mass and bulging up into the anterior rectus fascia.  I came through the fascia with 2 cm Adryan Druckenmiller margins medially and superiorly.  Then came around laterally and inferiorly.  Lobulated rocky mass involving much of the rectus muscle in this region was noted.  Gradually came through the rectus muscles with 2 cm Alan Drummer margins circumferentially especially superiorly and inferiorly.  The mass invaded into the deeper muscle layers to the peritoneum.  I came through the peritoneum closer towards the midline and then got 2 cm margins around the mass to completely excise it.  There were no adhesions in the peritoneal cavity to this region.  I assured hemostasis.  Given his obesity and then large resulting fascial defect I felt mesh closure would be needed to help.  He had a large swath of greater omentum that I could lay down and cover the entire right lower quadrant.  I chose a 23 x 17 Bard mesh dual sided ventral light.  I placed #1 Prolene sutures around the periphery x12.  I irrigated the peritoneum with  sterile water.  I then placed it into the peritoneal cavity and secured it with the tranfascial sutures using a Endo Close laparoscopic suture passer through the abdominal wall into the peritoneal cavity under direct visualization with the help of a large malleable ribbon retractor for protection.  Passed all 12 Prolene transfascial sutures radially such that the mesh layed well from right flank to left paramedian and subcostal to suprapubic region.  There were no gaps to the peritoneum with it being tacked down well.  The greater omentum had been positioned so no small bowel was near this region.  I tied the fascial sutures down to good result.  I then worked on closure.  I was able to mobilize the retrorectus fascia circumferentially and primarily close it in a mostly transverse slightly oblique fashion using #1 PDS in a running fashion starting at each corner and meeting in the center.  Minimal tension..  I did take a few bites of mesh as well to help secure it centrally.  I mobilized the subcutaneous fat off the anterior to fascia for a little more mobility.  I then reapproximated anterior rectus fascia transversely with running #1 PDS starting at each corners in a similar running fashion curvilinear transverse incision.  There was some moderate tension with this but it did come together without any tearing of the fascia.    I then placed a drain as noted above.  Secured to the skin with 2-0 Prolene.  I closed the subcutaneous tissues  using interrupted 2-0 Vicryl suture at Scarpa's and deep dermal interrupted sutures.  Closed the skin with 4-0 Monocryl running subcuticular suture.  Of note I did serial irrigations of sterile water with each level of closure from mesh placement to dermal closure.  Sterile dressing was applied. Patient is going to recovery room in stable condition.  Patient seems comfortable and wishes to try and go home.  We will see if that is possible versus overnight stay.    I  discussed postoperative care with the patient in my office. I discussed in the holding area. Instructions are written.  Long discussion of the operative findings, postop recommendations, instructions etc. with the patient's spouse, Jan.  Questions answered.  She expressed understanding and appreciation.  Most likely he needs to come back in about 10 days for nurse only visit to have the drain removed, then follow me a week or 2 later to make sure he is continue to recover   [name], M.D., F.A.C.S. Gastrointestinal and Minimally Invasive Surgery [facility] [address], [place], [place] [address] [phone] Main / Paging

## 2021-01-30 NOTE — Anesthesia Procedure Notes (Signed)
Procedure Name: Intubation Date/Time: 01/30/2021 2:39 PM Performed by: Raenette Rover, CRNA Pre-anesthesia Checklist: Patient identified, Emergency Drugs available, Suction available and Patient being monitored Patient Re-evaluated:Patient Re-evaluated prior to induction Oxygen Delivery Method: Circle system utilized Preoxygenation: Pre-oxygenation with 100% oxygen Induction Type: IV induction Ventilation: Mask ventilation without difficulty Laryngoscope Size: Miller and 3 Grade View: Grade I Tube type: Oral Tube size: 7.5 mm Number of attempts: 1 Airway Equipment and Method: Stylet Placement Confirmation: ETT inserted through vocal cords under direct vision, positive ETCO2 and breath sounds checked- equal and bilateral Secured at: 22 cm Tube secured with: Tape Dental Injury: Teeth and Oropharynx as per pre-operative assessment  Comments: Did not have difficulty with airway. Grade 1 with Miller 3.

## 2021-01-30 NOTE — Anesthesia Preprocedure Evaluation (Signed)
Anesthesia Evaluation  Patient identified by MRN, date of birth, ID band Patient awake    Reviewed: Allergy & Precautions, H&P , NPO status , Patient's Chart, lab work & pertinent test results  Airway Mallampati: II   Neck ROM: full    Dental   Pulmonary former smoker,    breath sounds clear to auscultation       Cardiovascular hypertension,  Rhythm:regular Rate:Normal     Neuro/Psych    GI/Hepatic   Endo/Other    Renal/GU Renal InsufficiencyRenal disease     Musculoskeletal   Abdominal   Peds  Hematology   Anesthesia Other Findings   Reproductive/Obstetrics                             Anesthesia Physical Anesthesia Plan  ASA: 3  Anesthesia Plan: General   Post-op Pain Management:    Induction: Intravenous  PONV Risk Score and Plan: 2 and Ondansetron, Dexamethasone, Midazolam and Treatment may vary due to age or medical condition  Airway Management Planned: Oral ETT  Additional Equipment:   Intra-op Plan:   Post-operative Plan: Extubation in OR  Informed Consent: I have reviewed the patients History and Physical, chart, labs and discussed the procedure including the risks, benefits and alternatives for the proposed anesthesia with the patient or authorized representative who has indicated his/her understanding and acceptance.     Dental advisory given  Plan Discussed with: Anesthesiologist, Surgeon and CRNA  Anesthesia Plan Comments:         Anesthesia Quick Evaluation

## 2021-01-30 NOTE — Interval H&P Note (Signed)
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  The various methods of treatment have been discussed with the patient and family. After consideration of risks, benefits and other options for treatment, the patient has consented to  Procedure(s): EXCISION ABDOMINAL WALL MASS WITH MESH RECONSTRUCTION (N/A) INSERTION OF MESH (N/A) as a surgical intervention.  The patient's history has been reviewed, patient examined, no change in status, stable for surgery.  I have reviewed the patient's chart and labs.  Questions were answered to the patient's satisfaction.    I have re-reviewed the the patient's records, history, medications, and allergies.  I have re-examined the patient.  I again discussed intraoperative plans and goals of post-operative recovery.  The patient agrees to proceed.  Jon Frost  Jun 16, 1960 [id number]  Patient Care Team: Ginger Organ., MD as PCP - General (Internal Medicine) End, Harrell Gave, MD as PCP - Cardiology (Cardiology) Michael Boston, MD as Consulting Physician (General Surgery) Clarene Essex, MD as Consulting Physician (Gastroenterology) Ladell Pier, MD as Consulting Physician (Oncology)  Patient Active Problem List   Diagnosis Date Noted   Bilateral inguinal herniae s/p laparoscopic repair with mesh 06/22/2018 23/53/6144   Metabolic syndrome 31/54/0086   [place] coronary artery calcium score greater than 400 10/27/2017   Thoracic aortic aneurysm without rupture ([place]) 10/27/2017   CKD (chronic kidney disease) stage 3, GFR 30-59 ml/min ([place]) 10/27/2017   Dyslipidemia, goal LDL below 70 10/27/2017   Snoring 10/27/2017   Rectal cancer s/p robotic low anterior rectosigmoid resection 11/13/2015 11/13/2015   Obesity 11/13/2015   Hypertension     Past Medical History:  Diagnosis Date   CKD (chronic kidney disease), stage III ([place])     Gout    History of kidney stones 2013   Hypertension    Hypertriglyceridemia    Impaired fasting glucose    Inguinal hernia    Metabolic syndrome    Obesity 11/13/2015   Rectal cancer s/p robotic low anterior rectosigmoid resection 11/13/2015 11/13/2015   Recurrent nephrolithiasis    Snoring    Tinnitus     Past Surgical History:  Procedure Laterality Date   INGUINAL HERNIA REPAIR N/A 06/22/2018   Procedure: LAPAROSCOPIC BILATERAL INGUINAL HERNIA REPAIR WITH INSERTION OF MESH, TAP BLOCK,  ERAS PATHWAY;  Surgeon: Michael Boston, MD;  Location: [facility];  Service: General;  Laterality: N/A;   IR US GUIDE BX ASP/DRAIN  01/01/2021   XI ROBOTIC ASSISTED LOWER ANTERIOR RESECTION  2017   Proximal rectal cancer.  [name]    Social History   Socioeconomic History   Marital status: Married    Spouse name: Jon Frost   Number of children: 0   Years of education: Not on file   Highest education level: Not on file  Occupational History   Not on file  Tobacco Use   Smoking status: Former    Packs/day: 0.50    Years: 15.00    Pack years: 7.50    Types: Cigarettes    Quit date: 11/09/1988    Years since quitting: 32.2   Smokeless tobacco: Never  Vaping Use   Vaping Use: Never used  Substance and Sexual Activity   Alcohol use: Not Currently    Comment: glass wine SELDOM    Drug use: No   Sexual activity: Not on file    Comment: JAN  Other Topics Concern   Not on file  Social History Narrative   Married, wife Jan   No children   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   Social Determinants of Radio broadcast assistant Strain: Not on file  Food Insecurity: Not on file  Transportation Needs: Not on file  Physical Activity: Not on file  Stress: Not on file  Social Connections: Not on file  Intimate Partner Violence: Not on file    Family History  Problem Relation Age of Onset   Diabetes Mother    AAA (abdominal aortic aneurysm) Mother    Diabetes  Father    CAD Father        CABG AGE 14   Hypertension Father    Diabetes Brother    CVA Maternal Grandfather        in his 43's   Cancer - Colon Paternal Grandfather     Medications Prior to Admission  Medication Sig Dispense Refill Last Dose   psyllium (METAMUCIL SMOOTH TEXTURE) 28 % packet Take 1 packet by mouth daily.   01/29/2021   rosuvastatin (CRESTOR) 10 MG tablet Take 10 mg by mouth at bedtime.   2 01/29/2021   valsartan-hydrochlorothiazide (DIOVAN-HCT) 80-12.5 MG tablet Take 0.5 tablets by mouth daily.   01/29/2021    Current Facility-Administered Medications  Medication Dose Route Frequency Provider Last Rate Last Admin   bupivacaine liposome (EXPAREL) 1.3 % injection 266 mg  20 mL Infiltration Once Michael Boston, MD       ceFAZolin (ANCEF) IVPB 2g/100 mL premix  2 g Intravenous On Call to OR Michael Boston, MD       Chlorhexidine Gluconate Cloth 2 % PADS 6 each  6 each Topical Once Michael Boston, MD       lactated ringers infusion   Intravenous Continuous Stoltzfus, March Rummage, DO 10 mL/hr at 01/30/21 1400 Continued from Pre-op at 01/30/21 1400     No Known Allergies  BP (!) 148/89   Pulse 68   Temp 98 F (36.7 C) (Oral)   Resp 17   SpO2 98%   Labs: Results for orders placed or performed during the hospital encounter of 01/30/21 (from the past 48 hour(s))  SARS Coronavirus 2 by RT PCR (hospital order, performed in [facility] hospital lab) Nasopharyngeal Nasopharyngeal Swab     Status: None   Collection Time: 01/30/21 12:00 PM   Specimen: Nasopharyngeal Swab  Result Value Ref Range   SARS Coronavirus 2 NEGATIVE NEGATIVE    Comment: (NOTE) SARS-CoV-2 target nucleic acids are NOT DETECTED.  The SARS-CoV-2 RNA is generally detectable in upper and lower respiratory specimens during the acute phase of infection. The lowest concentration of SARS-CoV-2 viral copies this assay can detect is 250 copies / mL. A negative result does not preclude SARS-CoV-2 infection and  should not be used as the sole basis for treatment or other patient management decisions.  A negative result may occur with improper specimen collection / handling, submission of specimen other than nasopharyngeal swab, presence of viral mutation(s) within the areas targeted by this assay, and inadequate number of viral copies (<250 copies / mL). A negative result must be combined with clinical observations, patient history, and epidemiological information.  Fact Sheet for Patients:   StrictlyIdeas.no  Fact Sheet for Healthcare Providers: BankingDealers.co.za  This test is not yet approved or  cleared by the [place] FDA and has been authorized for detection and/or diagnosis of SARS-CoV-2 by FDA under an Emergency Use Authorization (EUA).  This  EUA will remain in effect (meaning this test can be used) for the duration of the COVID-19 declaration under Section 564(b)(1) of the Act, 21 U.S.C. section 360bbb-3(b)(1), unless the authorization is terminated or revoked sooner.  Performed at [facility], [place] [address] [place], [place] [address]     Imaging / Studies: IR US Guide Bx Asp/Drain  Result Date: 01/01/2021 CLINICAL DATA:  Right lower quadrant deep body wall mass. Previous colorectal carcinoma post resection. EXAM: ULTRASOUND GUIDED CORE BIOPSY OF BODY WALL MASS MEDICATIONS: Intravenous Fentanyl 121mcg and Versed 3mg  were administered as conscious sedation during continuous monitoring of the patient's level of consciousness and physiological / cardiorespiratory status by the radiology RN, with a total moderate sedation time of 10 minutes. PROCEDURE: The procedure, risks, benefits, and alternatives were explained to the patient. Questions regarding the procedure were encouraged and answered. The patient understands and consents to the procedure. survey ultrasound of the right lower quadrant anterior body wall was  performed and a 4.6 cm mass corresponding to palpable region and CT findings was localized. An appropriate skin entry site was determined and marked. The operative field was prepped with chlorhexidine in a sterile fashion, and a sterile drape was applied covering the operative field. A sterile gown and sterile gloves were used for the procedure. Local anesthesia was provided with 1% Lidocaine. Under real-time ultrasound guidance, a 17 gauge trocar needle was advanced to the margin of the lesion. Once needle tip position was confirmed, coaxial 18-gauge core biopsy samples were obtained, submitted in formalin to surgical pathology. The guide needle was removed. Postprocedure scans show no hemorrhage or other apparent complication. COMPLICATIONS: None. FINDINGS: The right lower quadrant anterior body wall mass was localized. Representative core biopsy samples obtained as above. IMPRESSION: 1. Technically successful ultrasound-guided core biopsy, right lower quadrant body wall mass. Electronically Signed   By: Lucrezia Europe M.D.   On: 01/01/2021 13:23     .[name], M.D., F.A.C.S. Gastrointestinal and Minimally Invasive Surgery [facility] [address] [place], [place] [address] [phone] Main / Paging  01/30/2021 2:12 PM    [name]

## 2021-02-01 ENCOUNTER — Encounter (HOSPITAL_COMMUNITY): Payer: Self-pay | Admitting: Surgery

## 2021-02-01 NOTE — Anesthesia Postprocedure Evaluation (Signed)
Anesthesia Post Note  Patient: Jon Frost  Procedure(s) Performed: EXCISION ABDOMINAL WALL METASTASIS WITH ABDOMINAL WALL RECONSTRUCTION; TAP BLOCK INSERTION OF MESH     Patient location during evaluation: PACU Anesthesia Type: General Level of consciousness: awake and alert Pain management: pain level controlled Vital Signs Assessment: post-procedure vital signs reviewed and stable Respiratory status: spontaneous breathing, nonlabored ventilation, respiratory function stable and patient connected to nasal cannula oxygen Cardiovascular status: blood pressure returned to baseline and stable Postop Assessment: no apparent nausea or vomiting Anesthetic complications: no   No notable events documented.  Last Vitals:  Vitals:   01/30/21 1830 01/30/21 1845  BP: (!) 150/93 (!) 157/93  Pulse: 78 73  Resp: 11 12  Temp:  36.6 C  SpO2: 95% 96%    Last Pain:  Vitals:   01/30/21 1845  TempSrc:   PainSc: 2                  [name] S

## 2021-02-03 LAB — SURGICAL PATHOLOGY

## 2021-02-03 NOTE — Progress Notes (Signed)
Patient s/p resection of abdominal wall metastasis involving his right rectus muscle and abdominal wall/fascia with mesh reconstruction 01/30/2021  Pathology confirms metastatic colorectal cancer.  Margins negative.    Alisha, call back to update them on pathology reports.  I think it would make sense to set up for tumor board as well.   [place]:   1.  Please set the patient up for GI Tumor Board to discuss pathology and survivorship plan.  Patient Care Team: Ginger Organ., MD as PCP - General (Internal Medicine) End, Harrell Gave, MD as PCP - Cardiology (Cardiology) Michael Boston, MD as Consulting Physician (General Surgery) Clarene Essex, MD as Consulting Physician (Gastroenterology) Ladell Pier, MD as Consulting Physician (Oncology)

## 2021-02-09 ENCOUNTER — Inpatient Hospital Stay: Payer: BC Managed Care – PPO | Attending: Oncology | Admitting: Oncology

## 2021-02-09 ENCOUNTER — Other Ambulatory Visit: Payer: Self-pay

## 2021-02-09 VITALS — BP 125/80 | HR 64 | Temp 97.8°F | Resp 18 | Ht 70.0 in | Wt 223.6 lb

## 2021-02-09 DIAGNOSIS — Z87442 Personal history of urinary calculi: Secondary | ICD-10-CM | POA: Insufficient documentation

## 2021-02-09 DIAGNOSIS — Z800 Family history of malignant neoplasm of digestive organs: Secondary | ICD-10-CM | POA: Diagnosis not present

## 2021-02-09 DIAGNOSIS — C20 Malignant neoplasm of rectum: Secondary | ICD-10-CM

## 2021-02-09 DIAGNOSIS — C7951 Secondary malignant neoplasm of bone: Secondary | ICD-10-CM | POA: Diagnosis not present

## 2021-02-09 DIAGNOSIS — D125 Benign neoplasm of sigmoid colon: Secondary | ICD-10-CM | POA: Diagnosis not present

## 2021-02-09 DIAGNOSIS — D123 Benign neoplasm of transverse colon: Secondary | ICD-10-CM | POA: Diagnosis not present

## 2021-02-09 DIAGNOSIS — M109 Gout, unspecified: Secondary | ICD-10-CM | POA: Diagnosis not present

## 2021-02-09 NOTE — Progress Notes (Signed)
[facility] OFFICE PROGRESS NOTE   Diagnosis: Colon cancer  INTERVAL HISTORY:   Mr. Huston Foley returns as scheduled.  He underwent excision of the abdominal wall mass by Dr. Johney Maine on 01/30/2021.  He is recovering from surgery.  Surgical pain has improved.  No other complaint.  He is scheduled to see Dr. Johney Maine later today.  Objective:  Vital signs in last 24 hours:  Blood pressure 125/80, pulse 64, temperature 97.8 F (36.6 C), temperature source Oral, resp. rate 18, height _0  (1.778 m), weight 223 lb 9.6 oz (101.4 kg), SpO2 98 %.    Resp: Lungs clear bilaterally Cardio: Regular rate and rhythm GI: Healing right abdomen incision, JP drain in place with sanguinous drainage Vascular: No leg edema   Lab Results:  Lab Results  Component Value Date   WBC 4.5 01/01/2021   HGB 15.5 01/01/2021   HCT 44.3 01/01/2021   MCV 95.5 01/01/2021   PLT 128 (L) 01/01/2021   NEUTROABS 2.8 01/01/2021    CMP  Lab Results  Component Value Date   NA 140 01/01/2021   K 3.9 01/01/2021   CL 108 01/01/2021   CO2 26 01/01/2021   GLUCOSE 101 (H) 01/01/2021   BUN 17 01/01/2021   CREATININE 1.07 01/01/2021   CALCIUM 9.4 01/01/2021   GFRNONAA >60 01/01/2021   GFRAA >60 06/09/2018    Lab Results  Component Value Date   CEA1 35.58 (H) 11/14/2020   CEA 31.67 (H) 11/14/2020    Lab Results  Component Value Date   INR 1.1 01/01/2021   LABPROT 14.0 01/01/2021    Imaging:  No results found.  Medications: I have reviewed the patient's current medications.   Assessment/Plan: Rectal cancer, stage II (T3 N0), proximal rectum, status post a low anterior resection 11/13/2015 Microsatellite stable, no loss of mismatch repair protein expression 15 negative lymph nodes, no lymphovascular or perineural invasion Grade 1 Colonoscopy 04/09/2016-polyp was removed from the descending and transverse colon- tubular adenomas and sessile serrated polyp, polyp at the sigmoid  anastomosis-polypoid colonic mucosa Colonoscopy 05/31/2019-diverticulosis, internal hemorrhoids Elevated CEA June 2022 CTs 11/26/2020-no evidence of recurrent disease, mild circumferential wall thickening in the distal esophagus, further review confirms asymmetry with fullness in the right rectus/abdominal wall Guardant reveal 11/26/2020-ctDNA not detected Biopsy of right lower quadrant abdominal wall mass on 01/01/2021-metastatic adenocarcinoma 01/30/2021-excision of abdominal wall mass metastatic colorectal carcinoma, carcinoma involves fibroconnective tissue and skeletal muscle, carcinoma abuts but does not involve the inked resection margin   History of kidney stones   3.   Family history of colon cancer   4.   Multiple polyps noted on the colonoscopy 10/08/2015   5.   Gout    Disposition: Mr. Moose has been diagnosed with metastatic colon cancer.  He has undergone resection of an isolated right abdominal wall mass.  The pathology confirmed metastatic colorectal cancer.  I discussed the pathology findings and treatment options with Mr. Reynald.  He did not receive systemic therapy in the adjuvant setting.  He is now 5 years out from diagnosis.  We discussed observation, adjuvant chemotherapy, and radiation.  I will present his [place] at the GI tumor conference on 02/11/2021.  I recommend adjuvant systemic chemotherapy.  We discussed single agent capecitabine and capecitabine/5-fluorouracil plus oxaliplatin.  We discussed the decrease in relapse rate associated with single agent 5-fluorouracil and the addition of oxaliplatin in patients with resected stage III colon cancer.  We reviewed potential toxicities associated with capecitabine, 5-fluorouracil, and oxaliplatin.  He understands the need for Port-A-Cath placement in order to receive oxaliplatin.  Mr. Elster is most comfortable proceeding with single agent capecitabine.  We discussed toxicities associated with this agent including the chance of  nausea, mucositis, diarrhea, and hematologic toxicity.  We discussed the rash, sun sensitivity, hyperpigmentation, and hand/foot syndrome associated with capecitabine.   He will return for an office visit, repeat CEA, and further discussion in 2 weeks.  Betsy Coder, MD  02/09/2021  9:48 AM

## 2021-02-11 ENCOUNTER — Other Ambulatory Visit: Payer: Self-pay

## 2021-02-11 NOTE — Progress Notes (Signed)
The proposed treatment discussed in conference is for discussion purpose only and is not a binding recommendation.  The patients have not been physically examined, or presented with their treatment options.  Therefore, final treatment plans cannot be decided.  

## 2021-02-24 ENCOUNTER — Other Ambulatory Visit: Payer: Self-pay

## 2021-02-24 ENCOUNTER — Inpatient Hospital Stay (HOSPITAL_BASED_OUTPATIENT_CLINIC_OR_DEPARTMENT_OTHER): Payer: BC Managed Care – PPO | Admitting: Oncology

## 2021-02-24 ENCOUNTER — Inpatient Hospital Stay: Payer: BC Managed Care – PPO

## 2021-02-24 VITALS — BP 133/88 | HR 64 | Temp 98.1°F | Resp 18 | Ht 70.0 in | Wt 224.0 lb

## 2021-02-24 DIAGNOSIS — C20 Malignant neoplasm of rectum: Secondary | ICD-10-CM

## 2021-02-24 LAB — CBC WITH DIFFERENTIAL (CANCER CENTER ONLY)
Abs Immature Granulocytes: 0.01 10*3/uL (ref 0.00–0.07)
Basophils Absolute: 0 10*3/uL (ref 0.0–0.1)
Basophils Relative: 1 %
Eosinophils Absolute: 0.4 10*3/uL (ref 0.0–0.5)
Eosinophils Relative: 8 %
HCT: 43 % (ref 39.0–52.0)
Hemoglobin: 15.2 g/dL (ref 13.0–17.0)
Immature Granulocytes: 0 %
Lymphocytes Relative: 16 %
Lymphs Abs: 0.8 10*3/uL (ref 0.7–4.0)
MCH: 33.4 pg (ref 26.0–34.0)
MCHC: 35.3 g/dL (ref 30.0–36.0)
MCV: 94.5 fL (ref 80.0–100.0)
Monocytes Absolute: 0.8 10*3/uL (ref 0.1–1.0)
Monocytes Relative: 16 %
Neutro Abs: 2.9 10*3/uL (ref 1.7–7.7)
Neutrophils Relative %: 59 %
Platelet Count: 133 10*3/uL — ABNORMAL LOW (ref 150–400)
RBC: 4.55 MIL/uL (ref 4.22–5.81)
RDW: 12.7 % (ref 11.5–15.5)
WBC Count: 4.8 10*3/uL (ref 4.0–10.5)
nRBC: 0 % (ref 0.0–0.2)

## 2021-02-24 LAB — CMP (CANCER CENTER ONLY)
ALT: 16 U/L (ref 0–44)
AST: 25 U/L (ref 15–41)
Albumin: 4.4 g/dL (ref 3.5–5.0)
Alkaline Phosphatase: 53 U/L (ref 38–126)
Anion gap: 6 (ref 5–15)
BUN: 15 mg/dL (ref 6–20)
CO2: 29 mmol/L (ref 22–32)
Calcium: 9.8 mg/dL (ref 8.9–10.3)
Chloride: 104 mmol/L (ref 98–111)
Creatinine: 1.11 mg/dL (ref 0.61–1.24)
GFR, Estimated: >60 mL/min (ref >60)
Glucose, Bld: 99 mg/dL (ref 70–99)
Potassium: 4.2 mmol/L (ref 3.5–5.1)
Sodium: 139 mmol/L (ref 135–145)
Total Bilirubin: 1.2 mg/dL (ref 0.3–1.2)
Total Protein: 7.3 g/dL (ref 6.5–8.1)

## 2021-02-24 LAB — CEA (ACCESS): CEA (CHCC): 6.81 ng/mL — ABNORMAL HIGH (ref 0.00–5.00)

## 2021-02-24 MED ORDER — CAPECITABINE 500 MG PO TABS
ORAL_TABLET | ORAL | 0 refills | Status: DC
  Filled 2021-02-24: qty 112, 14d supply, fill #0

## 2021-02-24 NOTE — Progress Notes (Signed)
[facility] OFFICE PROGRESS NOTE   Diagnosis: Colon cancer  INTERVAL HISTORY:   Jon Frost returns as scheduled.  Here with his wife.  He feels well.  He is recovered from the recent surgery.  No new complaint.  Objective:  Vital signs in last 24 hours:  Blood pressure 133/88, pulse 64, temperature 98.1 F (36.7 C), resp. rate 18, height '5\' 10"'  (1.778 m), weight 224 lb (101.6 kg), SpO2 99 %.     Resp: Lungs clear bilaterally Cardio: Regular rate and rhythm GI: Healed surgical incisions, no mass, no hepatosplenomegaly Vascular: No leg edema  Lab Results:  Lab Results  Component Value Date   WBC 4.8 02/24/2021   HGB 15.2 02/24/2021   HCT 43.0 02/24/2021   MCV 94.5 02/24/2021   PLT 133 (L) 02/24/2021   NEUTROABS 2.9 02/24/2021    CMP  Lab Results  Component Value Date   NA 139 02/24/2021   K 4.2 02/24/2021   CL 104 02/24/2021   CO2 29 02/24/2021   GLUCOSE 99 02/24/2021   BUN 15 02/24/2021   CREATININE 1.11 02/24/2021   CALCIUM 9.8 02/24/2021   PROT 7.3 02/24/2021   ALBUMIN 4.4 02/24/2021   AST 25 02/24/2021   ALT 16 02/24/2021   ALKPHOS 53 02/24/2021   BILITOT 1.2 02/24/2021   GFRNONAA >60 02/24/2021   GFRAA >60 06/09/2018    Lab Results  Component Value Date   CEA1 35.58 (H) 11/14/2020   CEA 6.81 (H) 02/24/2021      Medications: I have reviewed the patient's current medications.   Assessment/Plan: Rectal cancer, stage II (T3 N0), proximal rectum, status post a low anterior resection 11/13/2015 Microsatellite stable, no loss of mismatch repair protein expression 15 negative lymph nodes, no lymphovascular or perineural invasion Grade 1 Colonoscopy 04/09/2016-polyp was removed from the descending and transverse colon- tubular adenomas and sessile serrated polyp, polyp at the sigmoid anastomosis-polypoid colonic mucosa Colonoscopy 05/31/2019-diverticulosis, internal hemorrhoids Elevated CEA June 2022 CTs 11/26/2020-no evidence of  recurrent disease, mild circumferential wall thickening in the distal esophagus, further review confirms asymmetry with fullness in the right rectus/abdominal wall Guardant reveal 11/26/2020-ctDNA not detected Biopsy of right lower quadrant abdominal wall mass on 01/01/2021-metastatic adenocarcinoma 01/30/2021-excision of abdominal wall mass metastatic colorectal carcinoma, carcinoma involves fibroconnective tissue and skeletal muscle, carcinoma abuts but does not involve the inked resection margin   History of kidney stones   3.   Family history of colon cancer   4.   Multiple polyps noted on the colonoscopy 10/08/2015   5.   Gout      Disposition: Jon Frost appears well.  He has recovered from the abdominal surgery.  We discussed adjuvant treatment options again today.  His [place] was presented at the GI tumor conference on 02/11/2021.  Adjuvant systemic chemotherapy is recommended.  Radiation was also recommended.  I discussed the treatment recommendations with Jon Frost.  He does not wish to receive radiation.  We discussed systemic chemotherapy options including single agent 5-fluorouracil therapy and the addition of oxaliplatin.  He prefers to proceed with single agent capecitabine.  We again reviewed potential toxicities associated with capecitabine including the chance of nausea, mucositis, diarrhea, and hematologic toxicity.  We discussed the rash, sun sensitivity, hyperpigmentation, and hand/foot syndrome associated with capecitabine.  He agrees to proceed.  The plan is to begin capecitabine on 03/02/2021.  The CEA remains mildly elevated.  This is most likely related to the prolonged half-life of CEA.  We will repeat  the CEA at the next office visit.   Betsy Coder, MD  02/24/2021  8:16 PM

## 2021-02-25 ENCOUNTER — Other Ambulatory Visit (HOSPITAL_COMMUNITY): Payer: Self-pay

## 2021-02-25 ENCOUNTER — Telehealth: Payer: Self-pay | Admitting: Pharmacist

## 2021-02-25 ENCOUNTER — Telehealth: Payer: Self-pay | Admitting: Pharmacy Technician

## 2021-02-25 DIAGNOSIS — C20 Malignant neoplasm of rectum: Secondary | ICD-10-CM

## 2021-02-25 MED ORDER — CAPECITABINE 500 MG PO TABS: 2000.0000 mg | ORAL_TABLET | Freq: Two times a day (BID) | ORAL | 0 refills | Status: DC

## 2021-02-25 NOTE — Telephone Encounter (Signed)
Oral Oncology Patient Advocate Encounter  Prior Authorization for Xeloda has been approved.    PA# [id number] Effective dates: 02/25/21 through 02/25/22  Patient must use [employer].  Will follow up for copay and delivery status.  Oral Oncology Clinic will continue to follow.   [place] Patient Jon Frost Phone [phone] Fax [phone] 02/25/2021 2:09 PM

## 2021-02-25 NOTE — Telephone Encounter (Signed)
Oral Oncology Pharmacist Encounter  Received new prescription for Xeloda (capecitabine) for the adjuvant treatment of stage II rectal cancer, planned duration 3-6 months. Planned start 03/02/21.  CMP from 02/24/21 assessed, no relevant lab abnormalities. Prescription dose and frequency assessed.   Current medication list in Epic reviewed, no DDIs with capecitabine identified.  Evaluated chart and no patient barriers to medication adherence identified.   Prescription has been e-scribed to the [facility] for benefits analysis and approval.  Oral Oncology Clinic will continue to follow for insurance authorization, copayment issues, initial counseling and start date.  Patient agreed to treatment on 02/24/21 per MD documentation.  Darl Pikes, PharmD, BCPS, BCOP, CPP Hematology/Oncology Clinical Pharmacist Practitioner [facility] [phone]  02/25/2021 12:55 PM

## 2021-02-25 NOTE — Telephone Encounter (Signed)
Oral Chemotherapy Pharmacist Encounter  Informed Mr. Auletta that his prescription was sent to [facility] per insurance requirement. Provided him with the phone number to the pharmacy.  Patient Education I spoke with patient for overview of new oral chemotherapy medication: Xeloda (capecitabine) for the adjuvant treatment of stage II rectal cancer, planned duration 3-6 months. Planned start 03/02/21 (pending [employer] delivery)  Counseled patient on administration, dosing, side effects, monitoring, drug-food interactions, safe handling, storage, and disposal. Patient will take 4 tablets (2,000 mg total) by mouth 2 (two) times daily after a meal. Take for 14 days, then hold for 7 days. Repeat every 21 days.  Side effects include but not limited to: diarrhea, hand-foot syndrome, mouth sores, edema, decreased wbc, fatigue, N/V.   Diarrhea: Currently Mr. Royal is having regular bowel movements. He will check to make sure he has loperamide on hand to use as needed Hand-foot syndrome: patient knows to call about any skin changes he see and he will also keep his hands/feet moisterized. Suggested the use Udderly Smooth Extra Care 20. Mouth sores: patient mentioned that his wife went through chemotherapy and used an OTC mouthwash, he plans on pick up some of the same mouthwash. Asked him to make sure the mouthwash does not contain alcohol because that can be more irritating. He knows he can call for magic mouth wash if needed.  Reviewed with patient importance of keeping a medication schedule and plan for any missed doses.  After discussion with patient no patient barriers to medication adherence identified.   Mr. Puskarich voiced understanding and appreciation. All questions answered. Medication handout provided.  Provided patient with [facility] phone number. Patient knows to call the office with questions or concerns. Oral Chemotherapy Navigation Clinic will continue to  follow.  Darl Pikes, PharmD, BCPS, BCOP, CPP Hematology/Oncology Clinical Pharmacist Practitioner [facility] [phone]  02/25/2021 3:40 PM

## 2021-02-25 NOTE — Telephone Encounter (Signed)
Oral Oncology Patient Advocate Encounter   Received notification from [facility] that prior authorization for Xeloda is required.   PA submitted on CoverMyMeds Key [id number] Status is pending   Oral Oncology Clinic will continue to follow.  [place] [facility] [place] Phone [phone] Fax [phone] 02/25/2021 1:50 PM

## 2021-02-27 ENCOUNTER — Encounter: Payer: Self-pay | Admitting: *Deleted

## 2021-02-27 ENCOUNTER — Other Ambulatory Visit: Payer: Self-pay | Admitting: Oncology

## 2021-02-27 DIAGNOSIS — C20 Malignant neoplasm of rectum: Secondary | ICD-10-CM

## 2021-02-27 NOTE — Telephone Encounter (Signed)
Oral Chemotherapy Pharmacist Encounter   [facility] will deliver Xeloda to Mr. Ventura on Monday 03/02/21. He knows to get started as planned when he has medication in hand.  Medication handout and calendar mailed to patient.   Darl Pikes, PharmD, BCPS, BCOP, CPP Hematology/Oncology Clinical Pharmacist [facility] [phone]  02/27/2021 10:38 AM

## 2021-02-27 NOTE — Progress Notes (Signed)
Jan, pt's wife called re: several questions, 1 billing from [facility] [employer] and [employer] molecular studies, both are in the appeal process, 2 Capecitabine prescription due to be delivered on Monday, instructed her to call [facility] Pharm[facility] when package arrives that we can document start of therapy,3 Use of Udderly Smooth extra care 20 cream on Hands and Feet for prevention of Hand/Foot Syndrome 4 Dental health and use of waterpik and biotene mouthwash, but to call if he develops any mouth sores so that it can be further evaluated.

## 2021-03-03 NOTE — Telephone Encounter (Signed)
Jon Frost called to report his Xeloda was delivered yesterday, but not until the afternoon. He waited until today 03/03/21 to get started to he could start with taking both an AM and PM dose.

## 2021-03-13 ENCOUNTER — Encounter: Payer: Self-pay | Admitting: *Deleted

## 2021-03-13 NOTE — Progress Notes (Signed)
PATIENT NAVIGATOR PROGRESS NOTE  Name: Jon Frost Date: 03/13/2021 MRN: [id number]  DOB: [birth year]   Reason for visit:  F/U after Capecitabine initiation  Comments:  Spoke with Jon Frost re: start of Capecitabine therapy. Jon Frost is working full time with some fatigue in the evenings but otherwise tolerating therapy well. He is on day 9 of 14 day therapy Cycle 1.  No nausea, no diarrhea, good appetite.   We discussed infection prevention.  Instructed them to call with any issues or questions    Time spent counseling/coordinating care: 15-30 minutes

## 2021-03-16 ENCOUNTER — Other Ambulatory Visit: Payer: Self-pay | Admitting: Oncology

## 2021-03-16 DIAGNOSIS — C20 Malignant neoplasm of rectum: Secondary | ICD-10-CM

## 2021-03-17 ENCOUNTER — Other Ambulatory Visit: Payer: Self-pay | Admitting: Pharmacist

## 2021-03-17 ENCOUNTER — Other Ambulatory Visit: Payer: Self-pay | Admitting: *Deleted

## 2021-03-17 ENCOUNTER — Telehealth: Payer: Self-pay | Admitting: *Deleted

## 2021-03-17 DIAGNOSIS — C20 Malignant neoplasm of rectum: Secondary | ICD-10-CM

## 2021-03-17 MED ORDER — CAPECITABINE 500 MG PO TABS: ORAL_TABLET | ORAL | 0 refills | Status: DC

## 2021-03-17 NOTE — Telephone Encounter (Signed)
[name] is required to fill at [facility]. There last shipment to [name] was a bit delayed to prevent a delay with cycle 2, spoke with Dr. Benay Spice who okay a refill to be sent in. [name] has already called [facility] to request a refill. Called [name] to let him know a refill Rx has been sent in.

## 2021-03-19 ENCOUNTER — Other Ambulatory Visit: Payer: BC Managed Care – PPO

## 2021-03-19 ENCOUNTER — Ambulatory Visit: Payer: BC Managed Care – PPO | Admitting: Oncology

## 2021-03-20 ENCOUNTER — Inpatient Hospital Stay: Payer: BC Managed Care – PPO | Attending: Oncology

## 2021-03-20 ENCOUNTER — Other Ambulatory Visit: Payer: Self-pay

## 2021-03-20 ENCOUNTER — Inpatient Hospital Stay (HOSPITAL_BASED_OUTPATIENT_CLINIC_OR_DEPARTMENT_OTHER): Payer: BC Managed Care – PPO | Admitting: Oncology

## 2021-03-20 VITALS — BP 124/86 | HR 63 | Temp 97.7°F | Resp 18 | Ht 70.0 in | Wt 224.0 lb

## 2021-03-20 DIAGNOSIS — R21 Rash and other nonspecific skin eruption: Secondary | ICD-10-CM | POA: Insufficient documentation

## 2021-03-20 DIAGNOSIS — C20 Malignant neoplasm of rectum: Secondary | ICD-10-CM | POA: Diagnosis not present

## 2021-03-20 DIAGNOSIS — M109 Gout, unspecified: Secondary | ICD-10-CM | POA: Diagnosis not present

## 2021-03-20 DIAGNOSIS — Z800 Family history of malignant neoplasm of digestive organs: Secondary | ICD-10-CM | POA: Insufficient documentation

## 2021-03-20 LAB — CBC WITH DIFFERENTIAL (CANCER CENTER ONLY)
Abs Immature Granulocytes: 0.02 10*3/uL (ref 0.00–0.07)
Basophils Absolute: 0 10*3/uL (ref 0.0–0.1)
Basophils Relative: 1 %
Eosinophils Absolute: 0.2 10*3/uL (ref 0.0–0.5)
Eosinophils Relative: 4 %
HCT: 41.1 % (ref 39.0–52.0)
Hemoglobin: 14.8 g/dL (ref 13.0–17.0)
Immature Granulocytes: 1 %
Lymphocytes Relative: 21 %
Lymphs Abs: 0.9 10*3/uL (ref 0.7–4.0)
MCH: 34.2 pg — ABNORMAL HIGH (ref 26.0–34.0)
MCHC: 36 g/dL (ref 30.0–36.0)
MCV: 94.9 fL (ref 80.0–100.0)
Monocytes Absolute: 0.7 10*3/uL (ref 0.1–1.0)
Monocytes Relative: 16 %
Neutro Abs: 2.5 10*3/uL (ref 1.7–7.7)
Neutrophils Relative %: 57 %
Platelet Count: 142 10*3/uL — ABNORMAL LOW (ref 150–400)
RBC: 4.33 MIL/uL (ref 4.22–5.81)
RDW: 14.6 % (ref 11.5–15.5)
WBC Count: 4.3 10*3/uL (ref 4.0–10.5)
nRBC: 0 % (ref 0.0–0.2)

## 2021-03-20 LAB — CMP (CANCER CENTER ONLY)
ALT: 10 U/L (ref 0–44)
AST: 23 U/L (ref 15–41)
Albumin: 4.5 g/dL (ref 3.5–5.0)
Alkaline Phosphatase: 47 U/L (ref 38–126)
Anion gap: 7 (ref 5–15)
BUN: 17 mg/dL (ref 6–20)
CO2: 29 mmol/L (ref 22–32)
Calcium: 10.1 mg/dL (ref 8.9–10.3)
Chloride: 104 mmol/L (ref 98–111)
Creatinine: 1.12 mg/dL (ref 0.61–1.24)
GFR, Estimated: >60 mL/min (ref >60)
Glucose, Bld: 77 mg/dL (ref 70–99)
Potassium: 4.3 mmol/L (ref 3.5–5.1)
Sodium: 140 mmol/L (ref 135–145)
Total Bilirubin: 1.5 mg/dL — ABNORMAL HIGH (ref 0.3–1.2)
Total Protein: 7.3 g/dL (ref 6.5–8.1)

## 2021-03-20 LAB — CEA (ACCESS): CEA (CHCC): 2.39 ng/mL (ref 0.00–5.00)

## 2021-03-20 NOTE — Progress Notes (Signed)
  [place] OFFICE PROGRESS NOTE   Diagnosis: Colon cancer  INTERVAL HISTORY:   Jon Frost returns as scheduled.  He began cycle 1 capecitabine on 03/03/2021.  No mouth sores, nausea, diarrhea, or hand/foot pain.  He has a mild rash at the forearms.  He continues to have soreness at the abdominal surgical site.  He recently passed a kidney stone.  Objective:  Vital signs in last 24 hours:  Blood pressure 124/86, pulse 63, temperature 97.7 F (36.5 C), temperature source Oral, resp. rate 18, height $RemoveBe'5\' 10"'DCCOFpPeo$  (1.778 m), weight 224 lb (101.6 kg), SpO2 98 %.    HEENT: No thrush or ulcers Resp: Lungs clear bilaterally Cardio: Regular rate and rhythm GI: No hepatosplenomegaly, healed surgical incision Vascular: No leg edema  Skin: Palms without erythema, mild hyperpigmentation of the soles.  Mild erythematous maculopapular rash at the forearms  Lab Results:  Lab Results  Component Value Date   WBC 4.3 03/20/2021   HGB 14.8 03/20/2021   HCT 41.1 03/20/2021   MCV 94.9 03/20/2021   PLT 142 (L) 03/20/2021   NEUTROABS 2.5 03/20/2021    CMP  Lab Results  Component Value Date   NA 140 03/20/2021   K 4.3 03/20/2021   CL 104 03/20/2021   CO2 29 03/20/2021   GLUCOSE 77 03/20/2021   BUN 17 03/20/2021   CREATININE 1.12 03/20/2021   CALCIUM 10.1 03/20/2021   PROT 7.3 03/20/2021   ALBUMIN 4.5 03/20/2021   AST 23 03/20/2021   ALT 10 03/20/2021   ALKPHOS 47 03/20/2021   BILITOT 1.5 (H) 03/20/2021   GFRNONAA >60 03/20/2021   GFRAA >60 06/09/2018    Lab Results  Component Value Date   CEA1 35.58 (H) 11/14/2020   CEA 6.81 (H) 02/24/2021    Lab Results  Component Value Date   INR 1.1 01/01/2021   LABPROT 14.0 01/01/2021    Medications: I have reviewed the patient's current medications.   Assessment/Plan:  Rectal cancer, stage II (T3 N0), proximal rectum, status post a low anterior resection 11/13/2015 Microsatellite stable, no loss of mismatch repair  protein expression 15 negative lymph nodes, no lymphovascular or perineural invasion Grade 1 Colonoscopy 04/09/2016-polyp was removed from the descending and transverse colon- tubular adenomas and sessile serrated polyp, polyp at the sigmoid anastomosis-polypoid colonic mucosa Colonoscopy 05/31/2019-diverticulosis, internal hemorrhoids Elevated CEA June 2022 CTs 11/26/2020-no evidence of recurrent disease, mild circumferential wall thickening in the distal esophagus, further review confirms asymmetry with fullness in the right rectus/abdominal wall Guardant reveal 11/26/2020-ctDNA not detected Biopsy of right lower quadrant abdominal wall mass on 01/01/2021-metastatic adenocarcinoma 01/30/2021-excision of abdominal wall mass metastatic colorectal carcinoma, carcinoma involves fibroconnective tissue and skeletal muscle, carcinoma abuts but does not involve the inked resection margin Cycle 1 Xeloda 03/03/2021 Cycle 2 Xeloda 03/24/2021   History of kidney stones   3.   Family history of colon cancer   4.   Multiple polyps noted on the colonoscopy 10/08/2015   5.   Gout      Disposition: Mr. Davee has completed 1 cycle of Xeloda.  He tolerated Xeloda well.  He has a mild Xeloda induced rash at the forearms.  He will complete cycle 2 beginning 03/24/2021.  The bilirubin is mildly elevated today, potentially related to Xeloda.  We will monitor this when he returns in 3 weeks.  We will follow-up on the CEA from today.  Betsy Coder, MD  03/20/2021  10:58 AM

## 2021-04-08 ENCOUNTER — Other Ambulatory Visit: Payer: Self-pay | Admitting: Oncology

## 2021-04-08 DIAGNOSIS — C20 Malignant neoplasm of rectum: Secondary | ICD-10-CM

## 2021-04-09 ENCOUNTER — Inpatient Hospital Stay: Payer: BC Managed Care – PPO | Attending: Oncology | Admitting: Oncology

## 2021-04-09 ENCOUNTER — Other Ambulatory Visit: Payer: Self-pay

## 2021-04-09 ENCOUNTER — Inpatient Hospital Stay: Payer: BC Managed Care – PPO

## 2021-04-09 VITALS — BP 118/80 | HR 74 | Temp 98.1°F | Resp 18 | Ht 70.0 in | Wt 227.0 lb

## 2021-04-09 DIAGNOSIS — Z79899 Other long term (current) drug therapy: Secondary | ICD-10-CM | POA: Insufficient documentation

## 2021-04-09 DIAGNOSIS — C20 Malignant neoplasm of rectum: Secondary | ICD-10-CM

## 2021-04-09 DIAGNOSIS — D125 Benign neoplasm of sigmoid colon: Secondary | ICD-10-CM | POA: Diagnosis not present

## 2021-04-09 DIAGNOSIS — R21 Rash and other nonspecific skin eruption: Secondary | ICD-10-CM | POA: Insufficient documentation

## 2021-04-09 DIAGNOSIS — Z87442 Personal history of urinary calculi: Secondary | ICD-10-CM | POA: Diagnosis not present

## 2021-04-09 DIAGNOSIS — D123 Benign neoplasm of transverse colon: Secondary | ICD-10-CM | POA: Diagnosis not present

## 2021-04-09 DIAGNOSIS — Z800 Family history of malignant neoplasm of digestive organs: Secondary | ICD-10-CM | POA: Diagnosis not present

## 2021-04-09 DIAGNOSIS — K648 Other hemorrhoids: Secondary | ICD-10-CM | POA: Insufficient documentation

## 2021-04-09 DIAGNOSIS — C19 Malignant neoplasm of rectosigmoid junction: Secondary | ICD-10-CM | POA: Insufficient documentation

## 2021-04-09 DIAGNOSIS — M109 Gout, unspecified: Secondary | ICD-10-CM | POA: Insufficient documentation

## 2021-04-09 LAB — CBC WITH DIFFERENTIAL (CANCER CENTER ONLY)
Abs Immature Granulocytes: 0.02 10*3/uL (ref 0.00–0.07)
Basophils Absolute: 0 10*3/uL (ref 0.0–0.1)
Basophils Relative: 1 %
Eosinophils Absolute: 0.2 10*3/uL (ref 0.0–0.5)
Eosinophils Relative: 4 %
HCT: 39.8 % (ref 39.0–52.0)
Hemoglobin: 14.2 g/dL (ref 13.0–17.0)
Immature Granulocytes: 0 %
Lymphocytes Relative: 21 %
Lymphs Abs: 1 10*3/uL (ref 0.7–4.0)
MCH: 34.9 pg — ABNORMAL HIGH (ref 26.0–34.0)
MCHC: 35.7 g/dL (ref 30.0–36.0)
MCV: 97.8 fL (ref 80.0–100.0)
Monocytes Absolute: 0.6 10*3/uL (ref 0.1–1.0)
Monocytes Relative: 13 %
Neutro Abs: 2.8 10*3/uL (ref 1.7–7.7)
Neutrophils Relative %: 61 %
Platelet Count: 151 10*3/uL (ref 150–400)
RBC: 4.07 MIL/uL — ABNORMAL LOW (ref 4.22–5.81)
RDW: 17.1 % — ABNORMAL HIGH (ref 11.5–15.5)
WBC Count: 4.6 10*3/uL (ref 4.0–10.5)
nRBC: 0 % (ref 0.0–0.2)

## 2021-04-09 LAB — CMP (CANCER CENTER ONLY)
ALT: 13 U/L (ref 0–44)
AST: 25 U/L (ref 15–41)
Albumin: 4.3 g/dL (ref 3.5–5.0)
Alkaline Phosphatase: 46 U/L (ref 38–126)
Anion gap: 6 (ref 5–15)
BUN: 16 mg/dL (ref 6–20)
CO2: 31 mmol/L (ref 22–32)
Calcium: 9.5 mg/dL (ref 8.9–10.3)
Chloride: 103 mmol/L (ref 98–111)
Creatinine: 1.13 mg/dL (ref 0.61–1.24)
GFR, Estimated: >60 mL/min (ref >60)
Glucose, Bld: 108 mg/dL — ABNORMAL HIGH (ref 70–99)
Potassium: 3.8 mmol/L (ref 3.5–5.1)
Sodium: 140 mmol/L (ref 135–145)
Total Bilirubin: 1.6 mg/dL — ABNORMAL HIGH (ref 0.3–1.2)
Total Protein: 6.5 g/dL (ref 6.5–8.1)

## 2021-04-09 LAB — CEA (ACCESS): CEA (CHCC): 1.54 ng/mL (ref 0.00–5.00)

## 2021-04-09 NOTE — Progress Notes (Signed)
[place] OFFICE PROGRESS NOTE   Diagnosis: Colorectal cancer  INTERVAL HISTORY:   Jon Governale completed another cycle of Xeloda beginning 03/24/2021.  No mouth sores, nausea, diarrhea, or hand/foot pain.  He has an erythematous rash at the forearms.  He reports intermittent discomfort at the right mid lateral abdomen for the past 2 weeks.  The pain is sometimes worse when he bends over.  He believes the discomfort is better over the past few days.  Objective:  Vital signs in last 24 hours:  Blood pressure 118/80, pulse 74, temperature 98.1 F (36.7 C), temperature source Oral, resp. rate 18, height $RemoveBe'5\' 10"'DCHHwOCWQ$  (1.778 m), weight 227 lb (103 kg), SpO2 100 %.    HEENT: No thrush or ulcers Resp: Lungs clear bilaterally Cardio: Regular rate and rhythm GI: No hepatosplenomegaly, healed surgical incisions, no mass, nontender, no erythema Vascular: No leg edema  Skin: Erythematous maculopapular rash at the dorsum of the forearm bilaterally, palms and soles without erythema   Lab Results:  Lab Results  Component Value Date   WBC 4.6 04/09/2021   HGB 14.2 04/09/2021   HCT 39.8 04/09/2021   MCV 97.8 04/09/2021   PLT 151 04/09/2021   NEUTROABS 2.8 04/09/2021    CMP  Lab Results  Component Value Date   NA 140 04/09/2021   K 3.8 04/09/2021   CL 103 04/09/2021   CO2 31 04/09/2021   GLUCOSE 108 (H) 04/09/2021   BUN 16 04/09/2021   CREATININE 1.13 04/09/2021   CALCIUM 9.5 04/09/2021   PROT 6.5 04/09/2021   ALBUMIN 4.3 04/09/2021   AST 25 04/09/2021   ALT 13 04/09/2021   ALKPHOS 46 04/09/2021   BILITOT 1.6 (H) 04/09/2021   GFRNONAA >60 04/09/2021   GFRAA >60 06/09/2018    Lab Results  Component Value Date   CEA1 35.58 (H) 11/14/2020   CEA 1.54 04/09/2021    Medications: I have reviewed the patient's current medications.   Assessment/Plan: Rectal cancer, stage II (T3 N0), proximal rectum, status post a low anterior resection 11/13/2015 Microsatellite  stable, no loss of mismatch repair protein expression 15 negative lymph nodes, no lymphovascular or perineural invasion Grade 1 Colonoscopy 04/09/2016-polyp was removed from the descending and transverse colon- tubular adenomas and sessile serrated polyp, polyp at the sigmoid anastomosis-polypoid colonic mucosa Colonoscopy 05/31/2019-diverticulosis, internal hemorrhoids Elevated CEA June 2022 CTs 11/26/2020-no evidence of recurrent disease, mild circumferential wall thickening in the distal esophagus, further review confirms asymmetry with fullness in the right rectus/abdominal wall Guardant reveal 11/26/2020-ctDNA not detected Biopsy of right lower quadrant abdominal wall mass on 01/01/2021-metastatic adenocarcinoma 01/30/2021-excision of abdominal wall mass metastatic colorectal carcinoma, carcinoma involves fibroconnective tissue and skeletal muscle, carcinoma abuts but does not involve the inked resection margin Cycle 1 Xeloda 03/03/2021 Cycle 2 Xeloda 03/24/2021 Cycle 3 Xeloda 04/14/2021   History of kidney stones   3.   Family history of colon cancer   4.   Multiple polyps noted on the colonoscopy 10/08/2015   5.   Gout       Disposition: Jon Frost has completed 2 cycles Xeloda.  He has tolerated the Xeloda well other than a mild forearm rash.  He will begin cycle 3 Xeloda 04/14/2021.  He will call for new symptoms.  He will return for an office and lab visit in 3 weeks.  The right abdominal discomfort is likely related to postsurgical pain.  He will call if the pain is worse.  Betsy Coder, MD  04/09/2021  3:54  PM

## 2021-04-30 ENCOUNTER — Other Ambulatory Visit: Payer: Self-pay

## 2021-04-30 ENCOUNTER — Inpatient Hospital Stay: Payer: BC Managed Care – PPO

## 2021-04-30 ENCOUNTER — Inpatient Hospital Stay (HOSPITAL_BASED_OUTPATIENT_CLINIC_OR_DEPARTMENT_OTHER): Payer: BC Managed Care – PPO | Admitting: Oncology

## 2021-04-30 VITALS — BP 122/87 | HR 74 | Temp 97.9°F | Resp 18 | Ht 70.0 in | Wt 228.0 lb

## 2021-04-30 DIAGNOSIS — C19 Malignant neoplasm of rectosigmoid junction: Secondary | ICD-10-CM | POA: Diagnosis not present

## 2021-04-30 DIAGNOSIS — C20 Malignant neoplasm of rectum: Secondary | ICD-10-CM

## 2021-04-30 LAB — CBC WITH DIFFERENTIAL (CANCER CENTER ONLY)
Abs Immature Granulocytes: 0.02 10*3/uL (ref 0.00–0.07)
Basophils Absolute: 0 10*3/uL (ref 0.0–0.1)
Basophils Relative: 0 %
Eosinophils Absolute: 0.2 10*3/uL (ref 0.0–0.5)
Eosinophils Relative: 4 %
HCT: 39.9 % (ref 39.0–52.0)
Hemoglobin: 14.3 g/dL (ref 13.0–17.0)
Immature Granulocytes: 1 %
Lymphocytes Relative: 22 %
Lymphs Abs: 0.9 10*3/uL (ref 0.7–4.0)
MCH: 35.6 pg — ABNORMAL HIGH (ref 26.0–34.0)
MCHC: 35.8 g/dL (ref 30.0–36.0)
MCV: 99.3 fL (ref 80.0–100.0)
Monocytes Absolute: 0.8 10*3/uL (ref 0.1–1.0)
Monocytes Relative: 18 %
Neutro Abs: 2.3 10*3/uL (ref 1.7–7.7)
Neutrophils Relative %: 55 %
Platelet Count: 137 10*3/uL — ABNORMAL LOW (ref 150–400)
RBC: 4.02 MIL/uL — ABNORMAL LOW (ref 4.22–5.81)
RDW: 17.5 % — ABNORMAL HIGH (ref 11.5–15.5)
WBC Count: 4.3 10*3/uL (ref 4.0–10.5)
nRBC: 0 % (ref 0.0–0.2)

## 2021-04-30 LAB — CMP (CANCER CENTER ONLY)
ALT: 15 U/L (ref 0–44)
AST: 28 U/L (ref 15–41)
Albumin: 4.3 g/dL (ref 3.5–5.0)
Alkaline Phosphatase: 49 U/L (ref 38–126)
Anion gap: 8 (ref 5–15)
BUN: 15 mg/dL (ref 6–20)
CO2: 26 mmol/L (ref 22–32)
Calcium: 9.2 mg/dL (ref 8.9–10.3)
Chloride: 104 mmol/L (ref 98–111)
Creatinine: 1.06 mg/dL (ref 0.61–1.24)
GFR, Estimated: >60 mL/min (ref >60)
Glucose, Bld: 89 mg/dL (ref 70–99)
Potassium: 3.9 mmol/L (ref 3.5–5.1)
Sodium: 138 mmol/L (ref 135–145)
Total Bilirubin: 2 mg/dL — ABNORMAL HIGH (ref 0.3–1.2)
Total Protein: 6.9 g/dL (ref 6.5–8.1)

## 2021-04-30 NOTE — Progress Notes (Signed)
[place] OFFICE PROGRESS NOTE   Diagnosis: Colon cancer  INTERVAL HISTORY:   Jon Frost returns as scheduled.  He completed another cycle of Xeloda beginning 04/14/2021.  No mouth sores, nausea, or diarrhea.  He has persistent rash at the forearms.  No hand or foot pain.  The abdominal discomfort has improved.  Objective:  Vital signs in last 24 hours:  Blood pressure 122/87, pulse 74, temperature 97.9 F (36.6 C), temperature source Oral, resp. rate 18, height _0  (1.778 m), weight 228 lb (103.4 kg), SpO2 100 %.    HEENT: No thrush or ulcers Resp: Lungs clear bilaterally Cardio: Regular rate and rhythm GI: No hepatosplenomegaly, nontender, no mass Vascular: No leg edema  Skin: Erythematous maculopapular rash at the forearm bilaterally, palms without erythema, mild hyperpigmentation, callus formation, and dryness at the soles  Lab Results:  Lab Results  Component Value Date   WBC 4.3 04/30/2021   HGB 14.3 04/30/2021   HCT 39.9 04/30/2021   MCV 99.3 04/30/2021   PLT 137 (L) 04/30/2021   NEUTROABS 2.3 04/30/2021    CMP  Lab Results  Component Value Date   NA 140 04/09/2021   K 3.8 04/09/2021   CL 103 04/09/2021   CO2 31 04/09/2021   GLUCOSE 108 (H) 04/09/2021   BUN 16 04/09/2021   CREATININE 1.13 04/09/2021   CALCIUM 9.5 04/09/2021   PROT 6.5 04/09/2021   ALBUMIN 4.3 04/09/2021   AST 25 04/09/2021   ALT 13 04/09/2021   ALKPHOS 46 04/09/2021   BILITOT 1.6 (H) 04/09/2021   GFRNONAA >60 04/09/2021   GFRAA >60 06/09/2018    Lab Results  Component Value Date   CEA1 35.58 (H) 11/14/2020   CEA 1.54 04/09/2021    Lab Results  Component Value Date   INR 1.1 01/01/2021   LABPROT 14.0 01/01/2021    Imaging:  No results found.  Medications: I have reviewed the patient's current medications.   Assessment/Plan: Rectal cancer, stage II (T3 N0), proximal rectum, status post a low anterior resection 11/13/2015 Microsatellite stable, no  loss of mismatch repair protein expression 15 negative lymph nodes, no lymphovascular or perineural invasion Grade 1 Colonoscopy 04/09/2016-polyp was removed from the descending and transverse colon- tubular adenomas and sessile serrated polyp, polyp at the sigmoid anastomosis-polypoid colonic mucosa Colonoscopy 05/31/2019-diverticulosis, internal hemorrhoids Elevated CEA June 2022 CTs 11/26/2020-no evidence of recurrent disease, mild circumferential wall thickening in the distal esophagus, further review confirms asymmetry with fullness in the right rectus/abdominal wall Guardant reveal 11/26/2020-ctDNA not detected Biopsy of right lower quadrant abdominal wall mass on 01/01/2021-metastatic adenocarcinoma 01/30/2021-excision of abdominal wall mass metastatic colorectal carcinoma, carcinoma involves fibroconnective tissue and skeletal muscle, carcinoma abuts but does not involve the inked resection margin Cycle 1 Xeloda 03/03/2021 Cycle 2 Xeloda 03/24/2021 Cycle 3 Xeloda 04/14/2021 Cycle 4 Xeloda 05/05/2021   History of kidney stones   3.   Family history of colon cancer   4.   Multiple polyps noted on the colonoscopy 10/08/2015   5.   Gout    Disposition: Jon Frost appears stable.  He is tolerating Xeloda well.  The forearm rash appears unchanged.  He will complete another cycle beginning 05/05/2021.  The bilirubin is mildly elevated, likely related to capecitabine.  We will consider a dose reduction of capecitabine if the liver enzymes become elevated.  Jon Frost will return for an office and lab visit in 3 weeks.  Betsy Coder, MD  04/30/2021  10:31 AM

## 2021-05-19 ENCOUNTER — Other Ambulatory Visit: Payer: Self-pay | Admitting: Oncology

## 2021-05-19 DIAGNOSIS — C20 Malignant neoplasm of rectum: Secondary | ICD-10-CM

## 2021-05-22 ENCOUNTER — Inpatient Hospital Stay: Payer: BC Managed Care – PPO

## 2021-05-22 ENCOUNTER — Encounter: Payer: Self-pay | Admitting: Oncology

## 2021-05-22 ENCOUNTER — Inpatient Hospital Stay: Payer: BC Managed Care – PPO | Attending: Oncology | Admitting: Oncology

## 2021-05-22 ENCOUNTER — Other Ambulatory Visit: Payer: Self-pay

## 2021-05-22 VITALS — BP 116/84 | HR 69 | Temp 97.8°F | Resp 18 | Ht 70.0 in | Wt 229.2 lb

## 2021-05-22 DIAGNOSIS — C20 Malignant neoplasm of rectum: Secondary | ICD-10-CM

## 2021-05-22 DIAGNOSIS — K648 Other hemorrhoids: Secondary | ICD-10-CM | POA: Diagnosis not present

## 2021-05-22 DIAGNOSIS — R110 Nausea: Secondary | ICD-10-CM | POA: Diagnosis not present

## 2021-05-22 DIAGNOSIS — Z86010 Personal history of colonic polyps: Secondary | ICD-10-CM | POA: Diagnosis not present

## 2021-05-22 DIAGNOSIS — C19 Malignant neoplasm of rectosigmoid junction: Secondary | ICD-10-CM | POA: Insufficient documentation

## 2021-05-22 DIAGNOSIS — Z79899 Other long term (current) drug therapy: Secondary | ICD-10-CM | POA: Diagnosis not present

## 2021-05-22 DIAGNOSIS — D125 Benign neoplasm of sigmoid colon: Secondary | ICD-10-CM | POA: Insufficient documentation

## 2021-05-22 DIAGNOSIS — Z800 Family history of malignant neoplasm of digestive organs: Secondary | ICD-10-CM | POA: Insufficient documentation

## 2021-05-22 DIAGNOSIS — R197 Diarrhea, unspecified: Secondary | ICD-10-CM | POA: Insufficient documentation

## 2021-05-22 DIAGNOSIS — R21 Rash and other nonspecific skin eruption: Secondary | ICD-10-CM | POA: Insufficient documentation

## 2021-05-22 DIAGNOSIS — M109 Gout, unspecified: Secondary | ICD-10-CM | POA: Insufficient documentation

## 2021-05-22 DIAGNOSIS — D123 Benign neoplasm of transverse colon: Secondary | ICD-10-CM | POA: Insufficient documentation

## 2021-05-22 DIAGNOSIS — Z87442 Personal history of urinary calculi: Secondary | ICD-10-CM | POA: Insufficient documentation

## 2021-05-22 LAB — CMP (CANCER CENTER ONLY)
ALT: 22 U/L (ref 0–44)
AST: 36 U/L (ref 15–41)
Albumin: 4.5 g/dL (ref 3.5–5.0)
Alkaline Phosphatase: 57 U/L (ref 38–126)
Anion gap: 5 (ref 5–15)
BUN: 16 mg/dL (ref 6–20)
CO2: 30 mmol/L (ref 22–32)
Calcium: 9.7 mg/dL (ref 8.9–10.3)
Chloride: 102 mmol/L (ref 98–111)
Creatinine: 1.06 mg/dL (ref 0.61–1.24)
GFR, Estimated: >60 mL/min (ref >60)
Glucose, Bld: 83 mg/dL (ref 70–99)
Potassium: 4.2 mmol/L (ref 3.5–5.1)
Sodium: 137 mmol/L (ref 135–145)
Total Bilirubin: 2.4 mg/dL — ABNORMAL HIGH (ref 0.3–1.2)
Total Protein: 7.2 g/dL (ref 6.5–8.1)

## 2021-05-22 LAB — CBC WITH DIFFERENTIAL (CANCER CENTER ONLY)
Abs Immature Granulocytes: 0.02 10*3/uL (ref 0.00–0.07)
Basophils Absolute: 0 10*3/uL (ref 0.0–0.1)
Basophils Relative: 1 %
Eosinophils Absolute: 0.2 10*3/uL (ref 0.0–0.5)
Eosinophils Relative: 4 %
HCT: 40.1 % (ref 39.0–52.0)
Hemoglobin: 14.4 g/dL (ref 13.0–17.0)
Immature Granulocytes: 0 %
Lymphocytes Relative: 20 %
Lymphs Abs: 0.9 10*3/uL (ref 0.7–4.0)
MCH: 36.5 pg — ABNORMAL HIGH (ref 26.0–34.0)
MCHC: 35.9 g/dL (ref 30.0–36.0)
MCV: 101.8 fL — ABNORMAL HIGH (ref 80.0–100.0)
Monocytes Absolute: 0.8 10*3/uL (ref 0.1–1.0)
Monocytes Relative: 18 %
Neutro Abs: 2.7 10*3/uL (ref 1.7–7.7)
Neutrophils Relative %: 57 %
Platelet Count: 140 10*3/uL — ABNORMAL LOW (ref 150–400)
RBC: 3.94 MIL/uL — ABNORMAL LOW (ref 4.22–5.81)
RDW: 17.7 % — ABNORMAL HIGH (ref 11.5–15.5)
WBC Count: 4.8 10*3/uL (ref 4.0–10.5)
nRBC: 0 % (ref 0.0–0.2)

## 2021-05-22 NOTE — Progress Notes (Signed)
[place] OFFICE PROGRESS NOTE   Diagnosis: Colon cancer  INTERVAL HISTORY:   Jon Frost returns as scheduled.  He completed another cycle of Xeloda beginning 05/05/2021.  No mouth sores.  He has noted burning at the tongue.  He is using a mouthwash which helps.  No hand or foot pain.  Stable rash of the forearms.  No diarrhea.  He has increased malaise following the most recent cycle of chemotherapy.  Good appetite.  Objective:  Vital signs in last 24 hours:  Blood pressure 116/84, pulse 69, temperature 97.8 F (36.6 C), temperature source Oral, resp. rate 18, height '5\' 10"'  (1.778 m), weight 229 lb 3.2 oz (104 kg), SpO2 100 %.    HEENT: No thrush or ulcers Resp: Lungs clear bilaterally Cardio: Regular rate and rhythm GI: Nontender, no mass, no hepatosplenomegaly Vascular: No leg edema Skin: Hyperpigmented maculopapular rash at the forearm bilaterally, dryness of the hands, callus formation and hyperpigmentation of the soles  Lab Results:  Lab Results  Component Value Date   WBC 4.8 05/22/2021   HGB 14.4 05/22/2021   HCT 40.1 05/22/2021   MCV 101.8 (H) 05/22/2021   PLT 140 (L) 05/22/2021   NEUTROABS 2.7 05/22/2021    CMP  Lab Results  Component Value Date   NA 137 05/22/2021   K 4.2 05/22/2021   CL 102 05/22/2021   CO2 30 05/22/2021   GLUCOSE 83 05/22/2021   BUN 16 05/22/2021   CREATININE 1.06 05/22/2021   CALCIUM 9.7 05/22/2021   PROT 7.2 05/22/2021   ALBUMIN 4.5 05/22/2021   AST 36 05/22/2021   ALT 22 05/22/2021   ALKPHOS 57 05/22/2021   BILITOT 2.4 (H) 05/22/2021   GFRNONAA >60 05/22/2021   GFRAA >60 06/09/2018    Lab Results  Component Value Date   CEA1 35.58 (H) 11/14/2020   CEA 1.54 04/09/2021     Medications: I have reviewed the patient's current medications.   Assessment/Plan: Rectal cancer, stage II (T3 N0), proximal rectum, status post a low anterior resection 11/13/2015 Microsatellite stable, no loss of mismatch repair  protein expression 15 negative lymph nodes, no lymphovascular or perineural invasion Grade 1 Colonoscopy 04/09/2016-polyp was removed from the descending and transverse colon- tubular adenomas and sessile serrated polyp, polyp at the sigmoid anastomosis-polypoid colonic mucosa Colonoscopy 05/31/2019-diverticulosis, internal hemorrhoids Elevated CEA June 2022 CTs 11/26/2020-no evidence of recurrent disease, mild circumferential wall thickening in the distal esophagus, further review confirms asymmetry with fullness in the right rectus/abdominal wall Guardant reveal 11/26/2020-ctDNA not detected Biopsy of right lower quadrant abdominal wall mass on 01/01/2021-metastatic adenocarcinoma 01/30/2021-excision of abdominal wall mass metastatic colorectal carcinoma, carcinoma involves fibroconnective tissue and skeletal muscle, carcinoma abuts but does not involve the inked resection margin Cycle 1 Xeloda 03/03/2021 Cycle 2 Xeloda 03/24/2021 Cycle 3 Xeloda 04/14/2021 Cycle 4 Xeloda 05/05/2021 Cycle 5 Xeloda 05/26/2021   History of kidney stones   3.   Family history of colon cancer   4.   Multiple polyps noted on the colonoscopy 10/08/2015   5.   Gout      Disposition: Jon Frost appears stable.  I suspect the malaise is related to cumulative toxicity from Xeloda.  He has mild hand/foot syndrome and a mild rash over the forearms.  The plan is to continue Xeloda at the current dose.  He will begin another cycle on 05/26/2021.  He will return for an office and lab visit on 06/09/2021.  Betsy Coder, MD  05/22/2021  11:41 AM

## 2021-06-09 ENCOUNTER — Inpatient Hospital Stay (HOSPITAL_BASED_OUTPATIENT_CLINIC_OR_DEPARTMENT_OTHER): Payer: BC Managed Care – PPO | Admitting: Oncology

## 2021-06-09 ENCOUNTER — Inpatient Hospital Stay: Payer: BC Managed Care – PPO

## 2021-06-09 ENCOUNTER — Other Ambulatory Visit: Payer: Self-pay

## 2021-06-09 VITALS — BP 116/80 | HR 73 | Temp 97.8°F | Resp 20 | Ht 70.0 in | Wt 230.0 lb

## 2021-06-09 DIAGNOSIS — C20 Malignant neoplasm of rectum: Secondary | ICD-10-CM

## 2021-06-09 DIAGNOSIS — C19 Malignant neoplasm of rectosigmoid junction: Secondary | ICD-10-CM | POA: Diagnosis not present

## 2021-06-09 LAB — CMP (CANCER CENTER ONLY)
ALT: 21 U/L (ref 0–44)
AST: 39 U/L (ref 15–41)
Albumin: 4.3 g/dL (ref 3.5–5.0)
Alkaline Phosphatase: 54 U/L (ref 38–126)
Anion gap: 7 (ref 5–15)
BUN: 16 mg/dL (ref 6–20)
CO2: 28 mmol/L (ref 22–32)
Calcium: 9.2 mg/dL (ref 8.9–10.3)
Chloride: 101 mmol/L (ref 98–111)
Creatinine: 1.12 mg/dL (ref 0.61–1.24)
GFR, Estimated: >60 mL/min (ref >60)
Glucose, Bld: 150 mg/dL — ABNORMAL HIGH (ref 70–99)
Potassium: 4.1 mmol/L (ref 3.5–5.1)
Sodium: 136 mmol/L (ref 135–145)
Total Bilirubin: 2.7 mg/dL — ABNORMAL HIGH (ref 0.3–1.2)
Total Protein: 7 g/dL (ref 6.5–8.1)

## 2021-06-09 LAB — CBC WITH DIFFERENTIAL (CANCER CENTER ONLY)
Abs Immature Granulocytes: 0.02 10*3/uL (ref 0.00–0.07)
Basophils Absolute: 0 10*3/uL (ref 0.0–0.1)
Basophils Relative: 1 %
Eosinophils Absolute: 0.2 10*3/uL (ref 0.0–0.5)
Eosinophils Relative: 4 %
HCT: 39.1 % (ref 39.0–52.0)
Hemoglobin: 14.2 g/dL (ref 13.0–17.0)
Immature Granulocytes: 1 %
Lymphocytes Relative: 16 %
Lymphs Abs: 0.7 10*3/uL (ref 0.7–4.0)
MCH: 37.7 pg — ABNORMAL HIGH (ref 26.0–34.0)
MCHC: 36.3 g/dL — ABNORMAL HIGH (ref 30.0–36.0)
MCV: 103.7 fL — ABNORMAL HIGH (ref 80.0–100.0)
Monocytes Absolute: 0.5 10*3/uL (ref 0.1–1.0)
Monocytes Relative: 11 %
Neutro Abs: 3 10*3/uL (ref 1.7–7.7)
Neutrophils Relative %: 67 %
Platelet Count: 126 10*3/uL — ABNORMAL LOW (ref 150–400)
RBC: 3.77 MIL/uL — ABNORMAL LOW (ref 4.22–5.81)
RDW: 16.9 % — ABNORMAL HIGH (ref 11.5–15.5)
WBC Count: 4.4 10*3/uL (ref 4.0–10.5)
nRBC: 0 % (ref 0.0–0.2)

## 2021-06-09 NOTE — Progress Notes (Signed)
[place] OFFICE PROGRESS NOTE   Diagnosis: Colon cancer  INTERVAL HISTORY:   Mr. Bohl begin another cycle of Xeloda beginning 05/26/2021.  No mouth sores.  He reports episodes of mild nausea.  No emesis.  He is eating.  He had a few episodes of diarrhea.  No hand or foot pain.  The rash at the lower arms has improved.  Objective:  Vital signs in last 24 hours:  Blood pressure 116/80, pulse 73, temperature 97.8 F (36.6 C), temperature source Oral, resp. rate 20, height '5\' 10"'  (1.778 m), weight 230 lb (104.3 kg), SpO2 99 %.    HEENT: No thrush or ulcers Resp: Lungs clear bilaterally Cardio: Regular rate and rhythm GI: No hepatosplenomegaly, nontender Vascular: No leg edema  Skin: Mild erythematous rash at the forearms and dorsum of the hands, palms without erythema, dryness and callus formation at the soles   Lab Results:  Lab Results  Component Value Date   WBC 4.4 06/09/2021   HGB 14.2 06/09/2021   HCT 39.1 06/09/2021   MCV 103.7 (H) 06/09/2021   PLT 126 (L) 06/09/2021   NEUTROABS 3.0 06/09/2021    CMP  Lab Results  Component Value Date   NA 136 06/09/2021   K 4.1 06/09/2021   CL 101 06/09/2021   CO2 28 06/09/2021   GLUCOSE 150 (H) 06/09/2021   BUN 16 06/09/2021   CREATININE 1.12 06/09/2021   CALCIUM 9.2 06/09/2021   PROT 7.0 06/09/2021   ALBUMIN 4.3 06/09/2021   AST 39 06/09/2021   ALT 21 06/09/2021   ALKPHOS 54 06/09/2021   BILITOT 2.7 (H) 06/09/2021   GFRNONAA >60 06/09/2021   GFRAA >60 06/09/2018    Lab Results  Component Value Date   CEA1 35.58 (H) 11/14/2020   CEA 1.54 04/09/2021     Medications: I have reviewed the patient's current medications.   Assessment/Plan: Rectal cancer, stage II (T3 N0), proximal rectum, status post a low anterior resection 11/13/2015 Microsatellite stable, no loss of mismatch repair protein expression 15 negative lymph nodes, no lymphovascular or perineural invasion Grade 1 Colonoscopy  04/09/2016-polyp was removed from the descending and transverse colon- tubular adenomas and sessile serrated polyp, polyp at the sigmoid anastomosis-polypoid colonic mucosa Colonoscopy 05/31/2019-diverticulosis, internal hemorrhoids Elevated CEA June 2022 CTs 11/26/2020-no evidence of recurrent disease, mild circumferential wall thickening in the distal esophagus, further review confirms asymmetry with fullness in the right rectus/abdominal wall Guardant reveal 11/26/2020-ctDNA not detected Biopsy of right lower quadrant abdominal wall mass on 01/01/2021-metastatic adenocarcinoma 01/30/2021-excision of abdominal wall mass metastatic colorectal carcinoma, carcinoma involves fibroconnective tissue and skeletal muscle, carcinoma abuts but does not involve the inked resection margin Cycle 1 Xeloda 03/03/2021 Cycle 2 Xeloda 03/24/2021 Cycle 3 Xeloda 04/14/2021 Cycle 4 Xeloda 05/05/2021 Cycle 5 Xeloda 05/26/2021 Cycle 6 Xeloda 06/16/2021   History of kidney stones   3.   Family history of colon cancer   4.   Multiple polyps noted on the colonoscopy 10/08/2015   5.   Gout  6.   Hyperbilirubinemia-likely secondary to toxicity from Xeloda      Disposition: Mr. Peets appears stable.  Has completed 5 cycles of Xeloda.  He is tolerating the chemotherapy well.  He has mild hand/foot syndrome.  He has developed mild hyperbilirubinemia since beginning Xeloda.  This is likely related to toxicity from Xeloda.  We will continue to monitor the bilirubin at each visit.  He will begin another cycle of Xeloda on 06/16/2021.  Mr Bulkley will return  for an office visit on 07/03/2021.   Betsy Coder, MD  06/09/2021  10:24 AM

## 2021-06-10 ENCOUNTER — Other Ambulatory Visit (HOSPITAL_COMMUNITY): Payer: Self-pay

## 2021-06-30 ENCOUNTER — Other Ambulatory Visit: Payer: Self-pay | Admitting: Oncology

## 2021-06-30 DIAGNOSIS — C20 Malignant neoplasm of rectum: Secondary | ICD-10-CM

## 2021-07-03 ENCOUNTER — Inpatient Hospital Stay: Payer: BC Managed Care – PPO

## 2021-07-03 ENCOUNTER — Other Ambulatory Visit: Payer: Self-pay

## 2021-07-03 ENCOUNTER — Inpatient Hospital Stay: Payer: BC Managed Care – PPO | Attending: Oncology | Admitting: Oncology

## 2021-07-03 VITALS — BP 125/83 | HR 69 | Temp 98.7°F | Resp 18 | Ht 70.0 in | Wt 231.0 lb

## 2021-07-03 DIAGNOSIS — C20 Malignant neoplasm of rectum: Secondary | ICD-10-CM

## 2021-07-03 DIAGNOSIS — Z9221 Personal history of antineoplastic chemotherapy: Secondary | ICD-10-CM | POA: Insufficient documentation

## 2021-07-03 DIAGNOSIS — Z800 Family history of malignant neoplasm of digestive organs: Secondary | ICD-10-CM | POA: Diagnosis not present

## 2021-07-03 DIAGNOSIS — Z85048 Personal history of other malignant neoplasm of rectum, rectosigmoid junction, and anus: Secondary | ICD-10-CM | POA: Diagnosis not present

## 2021-07-03 LAB — CMP (CANCER CENTER ONLY)
ALT: 22 U/L (ref 0–44)
AST: 40 U/L (ref 15–41)
Albumin: 4.4 g/dL (ref 3.5–5.0)
Alkaline Phosphatase: 58 U/L (ref 38–126)
Anion gap: 7 (ref 5–15)
BUN: 16 mg/dL (ref 6–20)
CO2: 27 mmol/L (ref 22–32)
Calcium: 9.4 mg/dL (ref 8.9–10.3)
Chloride: 102 mmol/L (ref 98–111)
Creatinine: 1.03 mg/dL (ref 0.61–1.24)
GFR, Estimated: >60 mL/min (ref >60)
Glucose, Bld: 87 mg/dL (ref 70–99)
Potassium: 4 mmol/L (ref 3.5–5.1)
Sodium: 136 mmol/L (ref 135–145)
Total Bilirubin: 2.8 mg/dL — ABNORMAL HIGH (ref 0.3–1.2)
Total Protein: 6.5 g/dL (ref 6.5–8.1)

## 2021-07-03 LAB — CBC WITH DIFFERENTIAL (CANCER CENTER ONLY)
Abs Immature Granulocytes: 0.02 10*3/uL (ref 0.00–0.07)
Basophils Absolute: 0 10*3/uL (ref 0.0–0.1)
Basophils Relative: 1 %
Eosinophils Absolute: 0.2 10*3/uL (ref 0.0–0.5)
Eosinophils Relative: 5 %
HCT: 37.8 % — ABNORMAL LOW (ref 39.0–52.0)
Hemoglobin: 13.8 g/dL (ref 13.0–17.0)
Immature Granulocytes: 1 %
Lymphocytes Relative: 21 %
Lymphs Abs: 0.9 10*3/uL (ref 0.7–4.0)
MCH: 38.8 pg — ABNORMAL HIGH (ref 26.0–34.0)
MCHC: 36.5 g/dL — ABNORMAL HIGH (ref 30.0–36.0)
MCV: 106.2 fL — ABNORMAL HIGH (ref 80.0–100.0)
Monocytes Absolute: 0.7 10*3/uL (ref 0.1–1.0)
Monocytes Relative: 17 %
Neutro Abs: 2.4 10*3/uL (ref 1.7–7.7)
Neutrophils Relative %: 55 %
Platelet Count: 137 10*3/uL — ABNORMAL LOW (ref 150–400)
RBC: 3.56 MIL/uL — ABNORMAL LOW (ref 4.22–5.81)
RDW: 16.7 % — ABNORMAL HIGH (ref 11.5–15.5)
WBC Count: 4.3 10*3/uL (ref 4.0–10.5)
nRBC: 0 % (ref 0.0–0.2)

## 2021-07-03 LAB — CEA (ACCESS): CEA (CHCC): 1.81 ng/mL (ref 0.00–5.00)

## 2021-07-03 NOTE — Progress Notes (Signed)
[place] OFFICE PROGRESS NOTE   Diagnosis: Colon cancer  INTERVAL HISTORY:   Jon. Borak begin another cycle of Xeloda on 06/16/2021.  No mouth sores, nausea, or diarrhea.  Stable skin changes at the hands and feet.  He reports malaise.  He had a recent flare of gout in the left ankle.  This resolved after 3 days of colchicine.  No other complaint.  Objective:  Vital signs in last 24 hours:  Blood pressure 125/83, pulse 69, temperature 98.7 F (37.1 C), temperature source Oral, resp. rate 18, height '5\' 10"'  (1.778 m), weight 231 lb (104.8 kg), SpO2 100 %.    HEENT: No thrush or ulcers Resp: Lungs clear bilaterally Cardio: Regular rate and rhythm GI: No hepatosplenomegaly, nontender Vascular: No leg edema  Skin: Mild hyperpigmented rash over the forearms, mild hyperpigmentation and skin thickening at the palms and soles.  No erythema.  Lab Results:  Lab Results  Component Value Date   WBC 4.3 07/03/2021   HGB 13.8 07/03/2021   HCT 37.8 (L) 07/03/2021   MCV 106.2 (H) 07/03/2021   PLT 137 (L) 07/03/2021   NEUTROABS 2.4 07/03/2021    CMP  Lab Results  Component Value Date   NA 136 06/09/2021   K 4.1 06/09/2021   CL 101 06/09/2021   CO2 28 06/09/2021   GLUCOSE 150 (H) 06/09/2021   BUN 16 06/09/2021   CREATININE 1.12 06/09/2021   CALCIUM 9.2 06/09/2021   PROT 7.0 06/09/2021   ALBUMIN 4.3 06/09/2021   AST 39 06/09/2021   ALT 21 06/09/2021   ALKPHOS 54 06/09/2021   BILITOT 2.7 (H) 06/09/2021   GFRNONAA >60 06/09/2021   GFRAA >60 06/09/2018    Lab Results  Component Value Date   CEA1 35.58 (H) 11/14/2020   CEA 1.54 04/09/2021    Medications: I have reviewed the patient's current medications.   Assessment/Plan: Rectal cancer, stage II (T3 N0), proximal rectum, status post a low anterior resection 11/13/2015 Microsatellite stable, no loss of mismatch repair protein expression 15 negative lymph nodes, no lymphovascular or perineural  invasion Grade 1 Colonoscopy 04/09/2016-polyp was removed from the descending and transverse colon- tubular adenomas and sessile serrated polyp, polyp at the sigmoid anastomosis-polypoid colonic mucosa Colonoscopy 05/31/2019-diverticulosis, internal hemorrhoids Elevated CEA June 2022 CTs 11/26/2020-no evidence of recurrent disease, mild circumferential wall thickening in the distal esophagus, further review confirms asymmetry with fullness in the right rectus/abdominal wall Guardant reveal 11/26/2020-ctDNA not detected Biopsy of right lower quadrant abdominal wall mass on 01/01/2021-metastatic adenocarcinoma 01/30/2021-excision of abdominal wall mass metastatic colorectal carcinoma, carcinoma involves fibroconnective tissue and skeletal muscle, carcinoma abuts but does not involve the inked resection margin Cycle 1 Xeloda 03/03/2021 Cycle 2 Xeloda 03/24/2021 Cycle 3 Xeloda 04/14/2021 Cycle 4 Xeloda 05/05/2021 Cycle 5 Xeloda 05/26/2021 Cycle 6 Xeloda 06/16/2021 Cycle 7 Xeloda 07/07/2021, dose reduction to 1500 mg twice daily secondary to hyperbilirubinemia   History of kidney stones   3.   Family history of colon cancer   4.   Multiple polyps noted on the colonoscopy 10/08/2015   5.   Gout  6.   Hyperbilirubinemia-likely secondary to toxicity from Xeloda       Disposition: Jon Frost has completed 6 cycles of adjuvant Xeloda.  He has tolerated the treatment well.  The bilirubin has increased since beginning Xeloda.  This is likely secondary to toxicity from Xeloda.  We decreased the Xeloda dose for cycle 7.  He will return for an office and lab visit in  3 weeks.  Betsy Coder, MD  07/03/2021  11:11 AM

## 2021-07-07 ENCOUNTER — Other Ambulatory Visit (HOSPITAL_COMMUNITY): Payer: Self-pay

## 2021-07-24 ENCOUNTER — Inpatient Hospital Stay (HOSPITAL_BASED_OUTPATIENT_CLINIC_OR_DEPARTMENT_OTHER): Payer: BC Managed Care – PPO | Admitting: Oncology

## 2021-07-24 ENCOUNTER — Inpatient Hospital Stay: Payer: BC Managed Care – PPO | Attending: Oncology

## 2021-07-24 ENCOUNTER — Other Ambulatory Visit: Payer: Self-pay

## 2021-07-24 VITALS — BP 118/83 | HR 74 | Temp 97.9°F | Resp 18 | Ht 70.0 in | Wt 232.6 lb

## 2021-07-24 DIAGNOSIS — M109 Gout, unspecified: Secondary | ICD-10-CM | POA: Diagnosis not present

## 2021-07-24 DIAGNOSIS — C20 Malignant neoplasm of rectum: Secondary | ICD-10-CM

## 2021-07-24 LAB — CMP (CANCER CENTER ONLY)
ALT: 21 U/L (ref 0–44)
AST: 41 U/L (ref 15–41)
Albumin: 4.2 g/dL (ref 3.5–5.0)
Alkaline Phosphatase: 54 U/L (ref 38–126)
Anion gap: 5 (ref 5–15)
BUN: 18 mg/dL (ref 6–20)
CO2: 29 mmol/L (ref 22–32)
Calcium: 9.9 mg/dL (ref 8.9–10.3)
Chloride: 103 mmol/L (ref 98–111)
Creatinine: 1.19 mg/dL (ref 0.61–1.24)
GFR, Estimated: >60 mL/min (ref >60)
Glucose, Bld: 117 mg/dL — ABNORMAL HIGH (ref 70–99)
Potassium: 4.4 mmol/L (ref 3.5–5.1)
Sodium: 137 mmol/L (ref 135–145)
Total Bilirubin: 2.4 mg/dL — ABNORMAL HIGH (ref 0.3–1.2)
Total Protein: 7 g/dL (ref 6.5–8.1)

## 2021-07-24 LAB — CBC WITH DIFFERENTIAL (CANCER CENTER ONLY)
Abs Immature Granulocytes: 0.02 10*3/uL (ref 0.00–0.07)
Basophils Absolute: 0 10*3/uL (ref 0.0–0.1)
Basophils Relative: 1 %
Eosinophils Absolute: 0.3 10*3/uL (ref 0.0–0.5)
Eosinophils Relative: 6 %
HCT: 39.2 % (ref 39.0–52.0)
Hemoglobin: 14.2 g/dL (ref 13.0–17.0)
Immature Granulocytes: 1 %
Lymphocytes Relative: 18 %
Lymphs Abs: 0.7 10*3/uL (ref 0.7–4.0)
MCH: 38.7 pg — ABNORMAL HIGH (ref 26.0–34.0)
MCHC: 36.2 g/dL — ABNORMAL HIGH (ref 30.0–36.0)
MCV: 106.8 fL — ABNORMAL HIGH (ref 80.0–100.0)
Monocytes Absolute: 0.6 10*3/uL (ref 0.1–1.0)
Monocytes Relative: 16 %
Neutro Abs: 2.3 10*3/uL (ref 1.7–7.7)
Neutrophils Relative %: 58 %
Platelet Count: 142 10*3/uL — ABNORMAL LOW (ref 150–400)
RBC: 3.67 MIL/uL — ABNORMAL LOW (ref 4.22–5.81)
RDW: 15.3 % (ref 11.5–15.5)
WBC Count: 3.9 10*3/uL — ABNORMAL LOW (ref 4.0–10.5)
nRBC: 0 % (ref 0.0–0.2)

## 2021-07-24 NOTE — Progress Notes (Signed)
?  Jon Frost ?OFFICE PROGRESS NOTE ? ? ?Diagnosis: Colon cancer ? ?INTERVAL HISTORY:  ? ?Jon Frost completed another cycle of Xeloda beginning 07/07/2021.  No mouth sores, nausea, or diarrhea.  No increase in hand or foot symptoms.  He is working.  He is planning to have a tooth pulled. ? ?Objective: ? ?Vital signs in last 24 hours: ? ?Blood pressure 118/83, pulse 74, temperature 97.9 ?F (36.6 ?C), temperature source Oral, resp. rate 18, height $RemoveBe'5\' 10"'bFqcvzcrt$  (1.778 m), weight 232 lb 9.6 oz (105.5 kg), SpO2 98 %. ?  ? ?HEENT: No thrush or ulcers ?Resp: Lungs clear bilaterally ?Cardio: Regular rate and rhythm ?GI: No hepatosplenomegaly, nontender, no mass ?Vascular: No leg edema  ?Skin: Dryness, skin thickening, and mild hyperpigmentation of the palms and soles ? ?Portacath/PICC-without erythema ? ?Lab Results: ? ?Lab Results  ?Component Value Date  ? WBC 3.9 (L) 07/24/2021  ? HGB 14.2 07/24/2021  ? HCT 39.2 07/24/2021  ? MCV 106.8 (H) 07/24/2021  ? PLT 142 (L) 07/24/2021  ? NEUTROABS 2.3 07/24/2021  ? ? ?CMP  ?Lab Results  ?Component Value Date  ? NA 137 07/24/2021  ? K 4.4 07/24/2021  ? CL 103 07/24/2021  ? CO2 29 07/24/2021  ? GLUCOSE 117 (H) 07/24/2021  ? BUN 18 07/24/2021  ? CREATININE 1.19 07/24/2021  ? CALCIUM 9.9 07/24/2021  ? PROT 7.0 07/24/2021  ? ALBUMIN 4.2 07/24/2021  ? AST 41 07/24/2021  ? ALT 21 07/24/2021  ? ALKPHOS 54 07/24/2021  ? BILITOT 2.4 (H) 07/24/2021  ? GFRNONAA >60 07/24/2021  ? GFRAA >60 06/09/2018  ? ? ?Lab Results  ?Component Value Date  ? CEA1 35.58 (H) 11/14/2020  ? CEA 1.81 07/03/2021  ? ?Medications: I have reviewed the patient's current medications. ? ? ?Assessment/Plan: ?Rectal cancer, stage II (T3 N0), proximal rectum, status post a low anterior resection 11/13/2015 ?Microsatellite stable, no loss of mismatch repair protein expression ?15 negative lymph nodes, no lymphovascular or perineural invasion ?Grade 1 ?Colonoscopy 04/09/2016-polyp was removed from the descending and  transverse colon- tubular adenomas and sessile serrated polyp, polyp at the sigmoid anastomosis-polypoid colonic mucosa ?Colonoscopy 05/31/2019-diverticulosis, internal hemorrhoids ?Elevated CEA June 2022 ?CTs 11/26/2020-no evidence of recurrent disease, mild circumferential wall thickening in the distal esophagus, further review confirms asymmetry with fullness in the right rectus/abdominal wall ?Guardant reveal 11/26/2020-ctDNA not detected ?Biopsy of right lower quadrant abdominal wall mass on 01/01/2021-metastatic adenocarcinoma ?01/30/2021-excision of abdominal wall mass metastatic colorectal carcinoma, carcinoma involves fibroconnective tissue and skeletal muscle, carcinoma abuts but does not involve the inked resection margin ?Cycle 1 Xeloda 03/03/2021 ?Cycle 2 Xeloda 03/24/2021 ?Cycle 3 Xeloda 04/14/2021 ?Cycle 4 Xeloda 05/05/2021 ?Cycle 5 Xeloda 05/26/2021 ?Cycle 6 Xeloda 06/16/2021 ?Cycle 7 Xeloda 07/07/2021, dose reduction to 1500 mg twice daily secondary to hyperbilirubinemia ?Cycle 8 Xeloda 07/28/2021, 1500 mg twice daily ?  ?History of kidney stones ?  ?3.   Family history of colon cancer ?  ?4.   Multiple polyps noted on the colonoscopy 10/08/2015 ?  ?5.   Gout ? ?6.   Hyperbilirubinemia-likely secondary to toxicity from Xeloda ?  ? ? ? ? ? ?Disposition: ?Jon Frost appears stable.  The bilirubin is lower today.  He will complete a final cycle of Xeloda beginning 07/28/2021.  He will return for an office and lab visit in 2 months.  We will check the CEA when he returns in 2 months. ? ?Betsy Coder, MD ? ?07/24/2021  ?1:12 PM ? ? ?

## 2021-09-25 ENCOUNTER — Inpatient Hospital Stay: Payer: BC Managed Care – PPO | Attending: Oncology

## 2021-09-25 ENCOUNTER — Inpatient Hospital Stay (HOSPITAL_BASED_OUTPATIENT_CLINIC_OR_DEPARTMENT_OTHER): Payer: BC Managed Care – PPO | Admitting: Oncology

## 2021-09-25 ENCOUNTER — Telehealth: Payer: Self-pay

## 2021-09-25 VITALS — BP 127/79 | HR 64 | Temp 98.1°F | Resp 20 | Ht 70.0 in | Wt 233.0 lb

## 2021-09-25 DIAGNOSIS — Z85048 Personal history of other malignant neoplasm of rectum, rectosigmoid junction, and anus: Secondary | ICD-10-CM | POA: Insufficient documentation

## 2021-09-25 DIAGNOSIS — C20 Malignant neoplasm of rectum: Secondary | ICD-10-CM

## 2021-09-25 DIAGNOSIS — Z9221 Personal history of antineoplastic chemotherapy: Secondary | ICD-10-CM | POA: Diagnosis not present

## 2021-09-25 DIAGNOSIS — Z800 Family history of malignant neoplasm of digestive organs: Secondary | ICD-10-CM | POA: Insufficient documentation

## 2021-09-25 DIAGNOSIS — Z87442 Personal history of urinary calculi: Secondary | ICD-10-CM | POA: Diagnosis not present

## 2021-09-25 DIAGNOSIS — M109 Gout, unspecified: Secondary | ICD-10-CM | POA: Insufficient documentation

## 2021-09-25 LAB — CBC WITH DIFFERENTIAL (CANCER CENTER ONLY)
Abs Immature Granulocytes: 0.02 10*3/uL (ref 0.00–0.07)
Basophils Absolute: 0 10*3/uL (ref 0.0–0.1)
Basophils Relative: 1 %
Eosinophils Absolute: 0.5 10*3/uL (ref 0.0–0.5)
Eosinophils Relative: 9 %
HCT: 41.6 % (ref 39.0–52.0)
Hemoglobin: 14.6 g/dL (ref 13.0–17.0)
Immature Granulocytes: 0 %
Lymphocytes Relative: 19 %
Lymphs Abs: 1 10*3/uL (ref 0.7–4.0)
MCH: 35 pg — ABNORMAL HIGH (ref 26.0–34.0)
MCHC: 35.1 g/dL (ref 30.0–36.0)
MCV: 99.8 fL (ref 80.0–100.0)
Monocytes Absolute: 0.8 10*3/uL (ref 0.1–1.0)
Monocytes Relative: 16 %
Neutro Abs: 2.8 10*3/uL (ref 1.7–7.7)
Neutrophils Relative %: 55 %
Platelet Count: 151 10*3/uL (ref 150–400)
RBC: 4.17 MIL/uL — ABNORMAL LOW (ref 4.22–5.81)
RDW: 11.5 % (ref 11.5–15.5)
WBC Count: 5.1 10*3/uL (ref 4.0–10.5)
nRBC: 0 % (ref 0.0–0.2)

## 2021-09-25 LAB — CMP (CANCER CENTER ONLY)
ALT: 28 U/L (ref 0–44)
AST: 46 U/L — ABNORMAL HIGH (ref 15–41)
Albumin: 4.3 g/dL (ref 3.5–5.0)
Alkaline Phosphatase: 48 U/L (ref 38–126)
Anion gap: 7 (ref 5–15)
BUN: 14 mg/dL (ref 6–20)
CO2: 30 mmol/L (ref 22–32)
Calcium: 9.6 mg/dL (ref 8.9–10.3)
Chloride: 102 mmol/L (ref 98–111)
Creatinine: 1.16 mg/dL (ref 0.61–1.24)
GFR, Estimated: >60 mL/min (ref >60)
Glucose, Bld: 82 mg/dL (ref 70–99)
Potassium: 4.1 mmol/L (ref 3.5–5.1)
Sodium: 139 mmol/L (ref 135–145)
Total Bilirubin: 1.6 mg/dL — ABNORMAL HIGH (ref 0.3–1.2)
Total Protein: 7.4 g/dL (ref 6.5–8.1)

## 2021-09-25 LAB — CEA (ACCESS): CEA (CHCC): 1.29 ng/mL (ref 0.00–5.00)

## 2021-09-25 NOTE — Telephone Encounter (Signed)
-----   Message from Ladell Pier, MD sent at 09/25/2021  2:02 PM EDT ----- Please call patient, the CEA is normal, follow-up as scheduled

## 2021-09-25 NOTE — Progress Notes (Signed)
[facility] OFFICE PROGRESS NOTE   Diagnosis: Rectal cancer  INTERVAL HISTORY:   Mr Jon Frost completed final cycle of Xeloda beginning 07/28/2021.  He reports an improved energy level.  Skin changes at the arms, hands, and feet have improved.  Mild intermittent discomfort in the right lower abdomen, relieved with having a bowel movement.  He is working.  Good appetite.  Objective:  Vital signs in last 24 hours:  Blood pressure 127/79, pulse 64, temperature 98.1 F (36.7 C), temperature source Oral, resp. rate 20, height $RemoveBe'5\' 10"'gtDxiqYlp$  (1.778 m), weight 233 lb (105.7 kg), SpO2 99 %.    HEENT: No thrush or ulcers Lymphatics: No cervical, supraclavicular, axillary, or inguinal nodes Resp: Lungs clear bilaterally Cardio: Regular rate and rhythm GI: No mass, no hepatosplenomegaly, nontender Vascular: No leg edema  Skin: Mild hyperpigmentation, dryness, and callus formation at the soles  Lab Results:  Lab Results  Component Value Date   WBC 5.1 09/25/2021   HGB 14.6 09/25/2021   HCT 41.6 09/25/2021   MCV 99.8 09/25/2021   PLT 151 09/25/2021   NEUTROABS 2.8 09/25/2021    CMP  Lab Results  Component Value Date   NA 137 07/24/2021   K 4.4 07/24/2021   CL 103 07/24/2021   CO2 29 07/24/2021   GLUCOSE 117 (H) 07/24/2021   BUN 18 07/24/2021   CREATININE 1.19 07/24/2021   CALCIUM 9.9 07/24/2021   PROT 7.0 07/24/2021   ALBUMIN 4.2 07/24/2021   AST 41 07/24/2021   ALT 21 07/24/2021   ALKPHOS 54 07/24/2021   BILITOT 2.4 (H) 07/24/2021   GFRNONAA >60 07/24/2021   GFRAA >60 06/09/2018    Lab Results  Component Value Date   CEA1 35.58 (H) 11/14/2020   CEA 1.81 07/03/2021     Medications: I have reviewed the patient's current medications.   Assessment/Plan: Rectal cancer, stage II (T3 N0), proximal rectum, status post a low anterior resection 11/13/2015 Microsatellite stable, no loss of mismatch repair protein expression 15 negative lymph nodes, no lymphovascular  or perineural invasion Grade 1 Colonoscopy 04/09/2016-polyp was removed from the descending and transverse colon- tubular adenomas and sessile serrated polyp, polyp at the sigmoid anastomosis-polypoid colonic mucosa Colonoscopy 05/31/2019-diverticulosis, internal hemorrhoids Elevated CEA June 2022 CTs 11/26/2020-no evidence of recurrent disease, mild circumferential wall thickening in the distal esophagus, further review confirms asymmetry with fullness in the right rectus/abdominal wall Guardant reveal 11/26/2020-ctDNA not detected Biopsy of right lower quadrant abdominal wall mass on 01/01/2021-metastatic adenocarcinoma 01/30/2021-excision of abdominal wall mass metastatic colorectal carcinoma, carcinoma involves fibroconnective tissue and skeletal muscle, carcinoma abuts but does not involve the inked resection margin Cycle 1 Xeloda 03/03/2021 Cycle 2 Xeloda 03/24/2021 Cycle 3 Xeloda 04/14/2021 Cycle 4 Xeloda 05/05/2021 Cycle 5 Xeloda 05/26/2021 Cycle 6 Xeloda 06/16/2021 Cycle 7 Xeloda 07/07/2021, dose reduction to 1500 mg twice daily secondary to hyperbilirubinemia Cycle 8 Xeloda 07/28/2021, 1500 mg twice daily   History of kidney stones   3.   Family history of colon cancer   4.   Multiple polyps noted on the colonoscopy 10/08/2015   5.   Gout  6.   Hyperbilirubinemia-likely secondary to toxicity from Xeloda     Disposition: History Jon Frost has a history of metastatic rectal cancer.  He underwent resection of a right lower abdominal wall mass 01/01/2021 followed by adjuvant capecitabine.  He is in clinical remission.  He will be scheduled for surveillance CTs and an office visit in late June.  Betsy Coder, MD  09/25/2021  11:01 AM

## 2021-09-25 NOTE — Telephone Encounter (Signed)
Pt verbalized understanding.

## 2021-10-20 ENCOUNTER — Inpatient Hospital Stay: Payer: BC Managed Care – PPO | Admitting: Oncology

## 2021-11-02 ENCOUNTER — Ambulatory Visit
Admission: RE | Admit: 2021-11-02 | Discharge: 2021-11-02 | Disposition: A | Discharge status: Home / Self Care | Payer: BC Managed Care – PPO | Source: Ambulatory Visit | Attending: Oncology | Admitting: Oncology

## 2021-11-02 DIAGNOSIS — C20 Malignant neoplasm of rectum: Secondary | ICD-10-CM

## 2021-11-02 MED ORDER — IOPAMIDOL (ISOVUE-300) INJECTION 61%
100.0000 mL | Freq: Once | INTRAVENOUS | Status: AC | PRN
  Administered 2021-11-02: 100 mL via INTRAVENOUS

## 2021-11-04 ENCOUNTER — Inpatient Hospital Stay: Payer: BC Managed Care – PPO | Attending: Oncology

## 2021-11-04 ENCOUNTER — Inpatient Hospital Stay (HOSPITAL_BASED_OUTPATIENT_CLINIC_OR_DEPARTMENT_OTHER): Payer: BC Managed Care – PPO | Admitting: Oncology

## 2021-11-04 VITALS — BP 139/83 | HR 64 | Temp 97.9°F | Resp 18 | Ht 70.0 in | Wt 233.0 lb

## 2021-11-04 DIAGNOSIS — Z85048 Personal history of other malignant neoplasm of rectum, rectosigmoid junction, and anus: Secondary | ICD-10-CM | POA: Diagnosis present

## 2021-11-04 DIAGNOSIS — Z79899 Other long term (current) drug therapy: Secondary | ICD-10-CM | POA: Insufficient documentation

## 2021-11-04 DIAGNOSIS — C20 Malignant neoplasm of rectum: Secondary | ICD-10-CM

## 2021-11-04 DIAGNOSIS — M109 Gout, unspecified: Secondary | ICD-10-CM | POA: Diagnosis not present

## 2021-11-04 LAB — CMP (CANCER CENTER ONLY)
ALT: 24 U/L (ref 0–44)
AST: 36 U/L (ref 15–41)
Albumin: 4.4 g/dL (ref 3.5–5.0)
Alkaline Phosphatase: 46 U/L (ref 38–126)
Anion gap: 9 (ref 5–15)
BUN: 13 mg/dL (ref 6–20)
CO2: 30 mmol/L (ref 22–32)
Calcium: 10.2 mg/dL (ref 8.9–10.3)
Chloride: 103 mmol/L (ref 98–111)
Creatinine: 1.26 mg/dL — ABNORMAL HIGH (ref 0.61–1.24)
GFR, Estimated: >60 mL/min (ref >60)
Glucose, Bld: 75 mg/dL (ref 70–99)
Potassium: 4.1 mmol/L (ref 3.5–5.1)
Sodium: 142 mmol/L (ref 135–145)
Total Bilirubin: 1.2 mg/dL (ref 0.3–1.2)
Total Protein: 7.7 g/dL (ref 6.5–8.1)

## 2021-11-04 LAB — CEA (ACCESS): CEA (CHCC): 1.24 ng/mL (ref 0.00–5.00)

## 2021-11-04 NOTE — Progress Notes (Signed)
[facility] OFFICE PROGRESS NOTE   Diagnosis: Colon cancer  INTERVAL HISTORY:   Jon Frost returns as scheduled.  He feels well.  Good appetite.  He has intermittent discomfort at the right lower abdomen in the mornings.  No pain at other times.  He is working.  Objective:  Vital signs in last 24 hours:  Blood pressure 139/83, pulse 64, temperature 97.9 F (36.6 C), temperature source Oral, resp. rate 18, height '5\' 10"'  (1.778 m), weight 233 lb (105.7 kg), SpO2 99 %.   Lymphatics: No cervical, supraclavicular, axillary, or inguinal nodes Resp: Lungs clear bilaterally Cardio: Regular rate and rhythm GI: No hepatosplenomegaly, no mass, nontender Vascular: No leg edema   Lab Results:  Lab Results  Component Value Date   WBC 5.1 09/25/2021   HGB 14.6 09/25/2021   HCT 41.6 09/25/2021   MCV 99.8 09/25/2021   PLT 151 09/25/2021   NEUTROABS 2.8 09/25/2021    CMP  Lab Results  Component Value Date   NA 139 09/25/2021   K 4.1 09/25/2021   CL 102 09/25/2021   CO2 30 09/25/2021   GLUCOSE 82 09/25/2021   BUN 14 09/25/2021   CREATININE 1.16 09/25/2021   CALCIUM 9.6 09/25/2021   PROT 7.4 09/25/2021   ALBUMIN 4.3 09/25/2021   AST 46 (H) 09/25/2021   ALT 28 09/25/2021   ALKPHOS 48 09/25/2021   BILITOT 1.6 (H) 09/25/2021   GFRNONAA >60 09/25/2021   GFRAA >60 06/09/2018    Lab Results  Component Value Date   CEA1 35.58 (H) 11/14/2020   CEA 1.29 09/25/2021    Imaging:  CT CHEST ABDOMEN PELVIS W CONTRAST  Result Date: 11/02/2021 CLINICAL DATA:  Rectal cancer staging in a 61 year old male. * Tracking Code: BO * EXAM: CT CHEST, ABDOMEN, AND PELVIS WITH CONTRAST TECHNIQUE: Multidetector CT imaging of the chest, abdomen and pelvis was performed following the standard protocol during bolus administration of intravenous contrast. RADIATION DOSE REDUCTION: This exam was performed according to the departmental dose-optimization program which includes automated  exposure control, adjustment of the mA and/or kV according to patient size and/or use of iterative reconstruction technique. CONTRAST:  164m ISOVUE-300 IOPAMIDOL (ISOVUE-300) INJECTION 61% COMPARISON:  November 26, 2020. FINDINGS: CT CHEST FINDINGS Cardiovascular: Calcified aortic atherosclerosis is very mild. No aneurysmal dilation of the thoracic aorta. Three-vessel coronary artery calcification. Normal heart size without pericardial effusion or signs of pericardial nodularity. Central pulmonary vasculature is of normal caliber. Mediastinum/Nodes: No thoracic inlet, axillary or mediastinal lymphadenopathy. No hilar adenopathy. Esophagus grossly normal. Lungs/Pleura: Biapical scarring similar to prior imaging. No effusion. No consolidative changes. Visualized airways are patent. Musculoskeletal: See below for full musculoskeletal details. CT ABDOMEN PELVIS FINDINGS Hepatobiliary: Mild hepatic steatosis. No focal, suspicious hepatic lesion. Portal vein is patent. No pericholecystic stranding or biliary duct dilation. Pancreas: Normal, without mass, inflammation or ductal dilatation. Spleen: Normal. Adrenals/Urinary Tract: Adrenal glands are unremarkable. Symmetric renal enhancement. No sign of hydronephrosis. No suspicious renal lesion or perinephric stranding. Urinary bladder is grossly unremarkable. Small cysts and lesions too small for definitive characterization in the bilateral kidneys largest in the lower pole the RIGHT kidney measuring 2.2 cm without change. No dedicated imaging follow-up is recommended for these findings. Urinary bladder is mildly under distended. Stomach/Bowel: Normal appendix. No acute gastrointestinal process. No visible colonic lesion. Colonic diverticulosis. Vascular/Lymphatic: Aortic atherosclerosis. No sign of aneurysm. Smooth contour of the IVC. There is no gastrohepatic or hepatoduodenal ligament lymphadenopathy. No retroperitoneal or mesenteric lymphadenopathy. No pelvic sidewall  lymphadenopathy. Reproductive: Prostate unremarkable by CT. Other: No abdominal wall hernia or abnormality. No abdominopelvic ascites. Musculoskeletal: No acute bone finding. No destructive bone process. Spinal degenerative changes. IMPRESSION: 1. No evidence of metastatic disease within the chest, abdomen or pelvis. 2. Mild hepatic steatosis. 3. Aortic atherosclerosis. 4. There is opacified urine in the urinary tract which could obscure small urinary tract calculi. No calculi are seen on today's study. Aortic Atherosclerosis (ICD10-I70.0). Electronically Signed   By: Zetta Bills M.D.   On: 11/02/2021 12:26    Medications: I have reviewed the patient's current medications.   Assessment/Plan: Rectal cancer, stage II (T3 N0), proximal rectum, status post a low anterior resection 11/13/2015 Microsatellite stable, no loss of mismatch repair protein expression 15 negative lymph nodes, no lymphovascular or perineural invasion Grade 1 Colonoscopy 04/09/2016-polyp was removed from the descending and transverse colon- tubular adenomas and sessile serrated polyp, polyp at the sigmoid anastomosis-polypoid colonic mucosa Colonoscopy 05/31/2019-diverticulosis, internal hemorrhoids Elevated CEA June 2022 CTs 11/26/2020-no evidence of recurrent disease, mild circumferential wall thickening in the distal esophagus, further review confirms asymmetry with fullness in the right rectus/abdominal wall Guardant reveal 11/26/2020-ctDNA not detected Biopsy of right lower quadrant abdominal wall mass on 01/01/2021-metastatic adenocarcinoma 01/30/2021-excision of abdominal wall mass metastatic colorectal carcinoma, carcinoma involves fibroconnective tissue and skeletal muscle, carcinoma abuts but does not involve the inked resection margin Cycle 1 Xeloda 03/03/2021 Cycle 2 Xeloda 03/24/2021 Cycle 3 Xeloda 04/14/2021 Cycle 4 Xeloda 05/05/2021 Cycle 5 Xeloda 05/26/2021 Cycle 6 Xeloda 06/16/2021 Cycle 7 Xeloda 07/07/2021, dose  reduction to 1500 mg twice daily secondary to hyperbilirubinemia Cycle 8 Xeloda 07/28/2021, 1500 mg twice daily CTs 11/02/2021-no evidence of metastatic disease   History of kidney stones   3.   Family history of colon cancer   4.   Multiple polyps noted on the colonoscopy 10/08/2015   5.   Gout  6.   Hyperbilirubinemia-likely secondary to toxicity from Xeloda      Disposition: Jon Frost is in clinical remission from colon cancer.  I suspect the intermittent right abdominal discomfort is related to surgery.  We will follow-up on the CEA from today.  He will return for an office visit and CEA in 4 months.  Betsy Coder, MD  11/04/2021  10:02 AM

## 2021-11-09 ENCOUNTER — Telehealth: Payer: Self-pay | Admitting: *Deleted

## 2021-11-09 DIAGNOSIS — C20 Malignant neoplasm of rectum: Secondary | ICD-10-CM

## 2021-11-09 NOTE — Telephone Encounter (Signed)
Informed [name] that Dr. Johney Maine and Dr. Benay Spice reviewed his CT images re: right abdominal muscle nodule. Feel most likely benign and could be related to surgery. Suggest non-contrast Ct abd/pelvis in 6 months. [name] agrees to plan.

## 2022-03-08 ENCOUNTER — Ambulatory Visit: Payer: BC Managed Care – PPO | Admitting: Oncology

## 2022-03-08 ENCOUNTER — Other Ambulatory Visit: Payer: BC Managed Care – PPO

## 2022-03-09 ENCOUNTER — Inpatient Hospital Stay: Payer: BC Managed Care – PPO | Attending: Oncology

## 2022-03-09 ENCOUNTER — Inpatient Hospital Stay (HOSPITAL_BASED_OUTPATIENT_CLINIC_OR_DEPARTMENT_OTHER): Payer: BC Managed Care – PPO | Admitting: Oncology

## 2022-03-09 VITALS — BP 129/84 | HR 60 | Temp 98.1°F | Resp 18 | Ht 70.0 in | Wt 236.4 lb

## 2022-03-09 DIAGNOSIS — Z87442 Personal history of urinary calculi: Secondary | ICD-10-CM | POA: Insufficient documentation

## 2022-03-09 DIAGNOSIS — C20 Malignant neoplasm of rectum: Secondary | ICD-10-CM

## 2022-03-09 DIAGNOSIS — Z86010 Personal history of colonic polyps: Secondary | ICD-10-CM | POA: Diagnosis not present

## 2022-03-09 DIAGNOSIS — Z800 Family history of malignant neoplasm of digestive organs: Secondary | ICD-10-CM | POA: Insufficient documentation

## 2022-03-09 DIAGNOSIS — M109 Gout, unspecified: Secondary | ICD-10-CM | POA: Insufficient documentation

## 2022-03-09 DIAGNOSIS — Z85038 Personal history of other malignant neoplasm of large intestine: Secondary | ICD-10-CM | POA: Diagnosis present

## 2022-03-09 LAB — CEA (ACCESS): CEA (CHCC): 1.39 ng/mL (ref 0.00–5.00)

## 2022-03-09 NOTE — Progress Notes (Signed)
[facility] OFFICE PROGRESS NOTE   Diagnosis: Colon cancer  INTERVAL HISTORY:   Jon Frost returns as scheduled.  He feels well.  Good appetite.  No difficulty with bowel or bladder function.  He has intermittent discomfort in the right lower abdomen with certain additions.  No consistent pain.  Skin changes from Xeloda have resolved.  Objective:  Vital signs in last 24 hours:  Blood pressure 129/84, pulse 60, temperature 98.1 F (36.7 C), temperature source Oral, resp. rate 18, height _0  (1.778 m), weight 236 lb 6.4 oz (107.2 kg), SpO2 99 %.    Lymphatics: No cervical, supraclavicular, axillary, or inguinal nodes Resp: Lungs clear bilaterally Cardio: Regular rate and rhythm GI: No hepatosplenomegaly, no mass, nontender Vascular: No leg edema  Skin: Hands without erythema, thickening, or skin breakdown  Portacath/PICC-without erythema  Lab Results:  Lab Results  Component Value Date   WBC 5.1 09/25/2021   HGB 14.6 09/25/2021   HCT 41.6 09/25/2021   MCV 99.8 09/25/2021   PLT 151 09/25/2021   NEUTROABS 2.8 09/25/2021    CMP  Lab Results  Component Value Date   NA 142 11/04/2021   K 4.1 11/04/2021   CL 103 11/04/2021   CO2 30 11/04/2021   GLUCOSE 75 11/04/2021   BUN 13 11/04/2021   CREATININE 1.26 (H) 11/04/2021   CALCIUM 10.2 11/04/2021   PROT 7.7 11/04/2021   ALBUMIN 4.4 11/04/2021   AST 36 11/04/2021   ALT 24 11/04/2021   ALKPHOS 46 11/04/2021   BILITOT 1.2 11/04/2021   GFRNONAA >60 11/04/2021   GFRAA >60 06/09/2018    Lab Results  Component Value Date   CEA1 35.58 (H) 11/14/2020   CEA 1.24 11/04/2021    Lab Results  Component Value Date   INR 1.1 01/01/2021   LABPROT 14.0 01/01/2021    Imaging:  No results found.  Medications: I have reviewed the patient's current medications.   Assessment/Plan:  Rectal cancer, stage II (T3 N0), proximal rectum, status post a low anterior resection 11/13/2015 Microsatellite stable,  no loss of mismatch repair protein expression 15 negative lymph nodes, no lymphovascular or perineural invasion Grade 1 Colonoscopy 04/09/2016-polyp was removed from the descending and transverse colon- tubular adenomas and sessile serrated polyp, polyp at the sigmoid anastomosis-polypoid colonic mucosa Colonoscopy 05/31/2019-diverticulosis, internal hemorrhoids Elevated CEA June 2022 CTs 11/26/2020-no evidence of recurrent disease, mild circumferential wall thickening in the distal esophagus, further review confirms asymmetry with fullness in the right rectus/abdominal wall Guardant reveal 11/26/2020-ctDNA not detected Biopsy of right lower quadrant abdominal wall mass on 01/01/2021-metastatic adenocarcinoma 01/30/2021-excision of abdominal wall mass metastatic colorectal carcinoma, carcinoma involves fibroconnective tissue and skeletal muscle, carcinoma abuts but does not involve the inked resection margin Cycle 1 Xeloda 03/03/2021 Cycle 2 Xeloda 03/24/2021 Cycle 3 Xeloda 04/14/2021 Cycle 4 Xeloda 05/05/2021 Cycle 5 Xeloda 05/26/2021 Cycle 6 Xeloda 06/16/2021 Cycle 7 Xeloda 07/07/2021, dose reduction to 1500 mg twice daily secondary to hyperbilirubinemia Cycle 8 Xeloda 07/28/2021, 1500 mg twice daily CTs 11/02/2021-no evidence of metastatic disease   History of kidney stones   3.   Family history of colon cancer   4.   Multiple polyps noted on the colonoscopy 10/08/2015   5.   Gout  6.   Hyperbilirubinemia-likely secondary to toxicity from Xeloda       Disposition: Jon Frost is in clinical remission from colon cancer.  We will follow-up on the CEA from today.  He will return for an office visit and CEA  in 4 months.  We will plan for surveillance CTs in June 2024.  He will call for new symptoms.  Jon Coder, MD  03/09/2022  11:02 AM

## 2022-03-30 ENCOUNTER — Encounter: Payer: Self-pay | Admitting: *Deleted

## 2022-07-06 ENCOUNTER — Inpatient Hospital Stay: Payer: BC Managed Care – PPO | Attending: Oncology

## 2022-07-06 ENCOUNTER — Inpatient Hospital Stay (HOSPITAL_BASED_OUTPATIENT_CLINIC_OR_DEPARTMENT_OTHER): Payer: BC Managed Care – PPO | Admitting: Oncology

## 2022-07-06 VITALS — BP 130/91 | HR 71 | Temp 98.2°F | Resp 18 | Ht 70.0 in | Wt 242.2 lb

## 2022-07-06 DIAGNOSIS — Z87442 Personal history of urinary calculi: Secondary | ICD-10-CM | POA: Insufficient documentation

## 2022-07-06 DIAGNOSIS — C20 Malignant neoplasm of rectum: Secondary | ICD-10-CM

## 2022-07-06 DIAGNOSIS — Z800 Family history of malignant neoplasm of digestive organs: Secondary | ICD-10-CM | POA: Insufficient documentation

## 2022-07-06 DIAGNOSIS — Z85048 Personal history of other malignant neoplasm of rectum, rectosigmoid junction, and anus: Secondary | ICD-10-CM | POA: Diagnosis present

## 2022-07-06 DIAGNOSIS — Z9221 Personal history of antineoplastic chemotherapy: Secondary | ICD-10-CM | POA: Diagnosis not present

## 2022-07-06 DIAGNOSIS — M109 Gout, unspecified: Secondary | ICD-10-CM | POA: Insufficient documentation

## 2022-07-06 LAB — CEA (ACCESS): CEA (CHCC): 1.19 ng/mL (ref 0.00–5.00)

## 2022-07-06 NOTE — Progress Notes (Signed)
  [facility] OFFICE PROGRESS NOTE   Diagnosis: Colon cancer  INTERVAL HISTORY:   Jon Frost returns as scheduled.  He feels well.  Good appetite.  He is working.  No difficulty with bowel function.  No abdominal pain.  No complaint.  Objective:  Vital signs in last 24 hours:  Blood pressure (!) 130/91, pulse 71, temperature 98.2 F (36.8 C), temperature source Oral, resp. rate 18, height 5' 10"$  (1.778 m), weight 242 lb 3.2 oz (109.9 kg), SpO2 98 %.   Lymphatics: No cervical, supraclavicular, axillary, or inguinal nodes Resp: Lungs clear bilaterally Cardio: Rate and rhythm GI: No mass, nontender, no hepatosplenomegaly Vascular: No leg edema   Lab Results:  Lab Results  Component Value Date   WBC 5.1 09/25/2021   HGB 14.6 09/25/2021   HCT 41.6 09/25/2021   MCV 99.8 09/25/2021   PLT 151 09/25/2021   NEUTROABS 2.8 09/25/2021    CMP  Lab Results  Component Value Date   NA 142 11/04/2021   K 4.1 11/04/2021   CL 103 11/04/2021   CO2 30 11/04/2021   GLUCOSE 75 11/04/2021   BUN 13 11/04/2021   CREATININE 1.26 (H) 11/04/2021   CALCIUM 10.2 11/04/2021   PROT 7.7 11/04/2021   ALBUMIN 4.4 11/04/2021   AST 36 11/04/2021   ALT 24 11/04/2021   ALKPHOS 46 11/04/2021   BILITOT 1.2 11/04/2021   GFRNONAA >60 11/04/2021   GFRAA >60 06/09/2018    Lab Results  Component Value Date   CEA1 35.58 (H) 11/14/2020   CEA 1.19 07/06/2022    Medications: I have reviewed the patient's current medications.   Assessment/Plan:  Rectal cancer, stage II (T3 N0), proximal rectum, status post a low anterior resection 11/13/2015 Microsatellite stable, no loss of mismatch repair protein expression 15 negative lymph nodes, no lymphovascular or perineural invasion Grade 1 Colonoscopy 04/09/2016-polyp was removed from the descending and transverse colon- tubular adenomas and sessile serrated polyp, polyp at the sigmoid anastomosis-polypoid colonic mucosa Colonoscopy  05/31/2019-diverticulosis, internal hemorrhoids Elevated CEA June 2022 CTs 11/26/2020-no evidence of recurrent disease, mild circumferential wall thickening in the distal esophagus, further review confirms asymmetry with fullness in the right rectus/abdominal wall Guardant reveal 11/26/2020-ctDNA not detected Biopsy of right lower quadrant abdominal wall mass on 01/01/2021-metastatic adenocarcinoma 01/30/2021-excision of abdominal wall mass metastatic colorectal carcinoma, carcinoma involves fibroconnective tissue and skeletal muscle, carcinoma abuts but does not involve the inked resection margin Cycle 1 Xeloda 03/03/2021 Cycle 2 Xeloda 03/24/2021 Cycle 3 Xeloda 04/14/2021 Cycle 4 Xeloda 05/05/2021 Cycle 5 Xeloda 05/26/2021 Cycle 6 Xeloda 06/16/2021 Cycle 7 Xeloda 07/07/2021, dose reduction to 1500 mg twice daily secondary to hyperbilirubinemia Cycle 8 Xeloda 07/28/2021, 1500 mg twice daily CTs 11/02/2021-no evidence of metastatic disease   History of kidney stones   3.   Family history of colon cancer   4.   Multiple polyps noted on the colonoscopy 10/08/2015 Colonoscopy 05/31/2019-no polyps   5.   Gout  6.   Hyperbilirubinemia-likely secondary to toxicity from Xeloda    Disposition: Jon Frost is in clinical remission from colon cancer.  The CEA is normal.  He will return for an office visit and surveillance CTs in 4 months.  Betsy Coder, MD  07/06/2022  10:25 AM

## 2022-10-29 ENCOUNTER — Inpatient Hospital Stay: Payer: BC Managed Care – PPO | Attending: Oncology

## 2022-10-29 ENCOUNTER — Ambulatory Visit
Admission: RE | Admit: 2022-10-29 | Discharge: 2022-10-29 | Disposition: A | Discharge status: Home / Self Care | Payer: BC Managed Care – PPO | Source: Ambulatory Visit | Attending: Oncology | Admitting: Oncology

## 2022-10-29 DIAGNOSIS — Z9221 Personal history of antineoplastic chemotherapy: Secondary | ICD-10-CM | POA: Diagnosis not present

## 2022-10-29 DIAGNOSIS — Z87442 Personal history of urinary calculi: Secondary | ICD-10-CM | POA: Insufficient documentation

## 2022-10-29 DIAGNOSIS — M109 Gout, unspecified: Secondary | ICD-10-CM | POA: Insufficient documentation

## 2022-10-29 DIAGNOSIS — Z85048 Personal history of other malignant neoplasm of rectum, rectosigmoid junction, and anus: Secondary | ICD-10-CM | POA: Insufficient documentation

## 2022-10-29 DIAGNOSIS — C20 Malignant neoplasm of rectum: Secondary | ICD-10-CM

## 2022-10-29 LAB — BASIC METABOLIC PANEL - CANCER CENTER ONLY
Anion gap: 6 (ref 5–15)
BUN: 16 mg/dL (ref 8–23)
CO2: 30 mmol/L (ref 22–32)
Calcium: 10.1 mg/dL (ref 8.9–10.3)
Chloride: 102 mmol/L (ref 98–111)
Creatinine: 1.19 mg/dL (ref 0.61–1.24)
GFR, Estimated: >60 mL/min (ref >60)
Glucose, Bld: 116 mg/dL — ABNORMAL HIGH (ref 70–99)
Potassium: 4.5 mmol/L (ref 3.5–5.1)
Sodium: 138 mmol/L (ref 135–145)

## 2022-10-29 LAB — CEA (ACCESS): CEA (CHCC): 1.21 ng/mL (ref 0.00–5.00)

## 2022-10-29 MED ORDER — IOPAMIDOL (ISOVUE-300) INJECTION 61%
100.0000 mL | Freq: Once | INTRAVENOUS | Status: AC | PRN
  Administered 2022-10-29: 100 mL via INTRAVENOUS

## 2022-11-02 ENCOUNTER — Inpatient Hospital Stay (HOSPITAL_BASED_OUTPATIENT_CLINIC_OR_DEPARTMENT_OTHER): Payer: BC Managed Care – PPO | Admitting: Oncology

## 2022-11-02 VITALS — BP 127/84 | HR 65 | Temp 98.1°F | Resp 18 | Ht 70.0 in | Wt 232.0 lb

## 2022-11-02 DIAGNOSIS — C20 Malignant neoplasm of rectum: Secondary | ICD-10-CM | POA: Diagnosis not present

## 2022-11-02 DIAGNOSIS — Z85048 Personal history of other malignant neoplasm of rectum, rectosigmoid junction, and anus: Secondary | ICD-10-CM | POA: Diagnosis not present

## 2022-11-02 NOTE — Progress Notes (Signed)
[facility] OFFICE PROGRESS NOTE   Diagnosis: Colon cancer  INTERVAL HISTORY:   Mr. Gidney turns as scheduled.  He feels well.  Good appetite.  No difficulty with bowel function.  No bleeding.  He is working.  No abdominal pain.  He reports intentional weight loss with exercise and a change in his diet.  Objective:  Vital signs in last 24 hours:  Blood pressure 127/84, pulse 65, temperature 98.1 F (36.7 C), temperature source Oral, resp. rate 18, height 5\' 10"  (1.778 m), weight 232 lb (105.2 kg), SpO2 98 %.    Lymphatics: No cervical, supraclavicular, axillary, or inguinal nodes Resp: Lungs clear bilaterally Cardio: Regular rate and rhythm GI: No mass, nontender, no hepatosplenomegaly, no evidence of recurrent tumor at the right abdominal scar Vascular: No leg edema  Lab Results:  Lab Results  Component Value Date   WBC 5.1 09/25/2021   HGB 14.6 09/25/2021   HCT 41.6 09/25/2021   MCV 99.8 09/25/2021   PLT 151 09/25/2021   NEUTROABS 2.8 09/25/2021    CMP  Lab Results  Component Value Date   NA 138 10/29/2022   K 4.5 10/29/2022   CL 102 10/29/2022   CO2 30 10/29/2022   GLUCOSE 116 (H) 10/29/2022   BUN 16 10/29/2022   CREATININE 1.19 10/29/2022   CALCIUM 10.1 10/29/2022   PROT 7.7 11/04/2021   ALBUMIN 4.4 11/04/2021   AST 36 11/04/2021   ALT 24 11/04/2021   ALKPHOS 46 11/04/2021   BILITOT 1.2 11/04/2021   GFRNONAA >60 10/29/2022   GFRAA >60 06/09/2018    Lab Results  Component Value Date   CEA1 35.58 (H) 11/14/2020   CEA 1.21 10/29/2022     Imaging:  CT CHEST ABDOMEN PELVIS W CONTRAST  Result Date: 11/01/2022 CLINICAL DATA:  Metastatic colon cancer surveillance, status post rectal resection * Tracking Code: BO * EXAM: CT CHEST, ABDOMEN, AND PELVIS WITH CONTRAST TECHNIQUE: Multidetector CT imaging of the chest, abdomen and pelvis was performed following the standard protocol during bolus administration of intravenous contrast. RADIATION  DOSE REDUCTION: This exam was performed according to the departmental dose-optimization program which includes automated exposure control, adjustment of the mA and/or kV according to patient size and/or use of iterative reconstruction technique. CONTRAST:  ISOVUE-300 IOPAMIDOL (ISOVUE-300) INJECTION 61% additional oral enteric contrast COMPARISON:  11/02/2021 FINDINGS: CT CHEST FINDINGS Cardiovascular: Aortic atherosclerosis. Normal heart size. Three-vessel coronary artery calcifications. No pericardial effusion. Mediastinum/Nodes: No enlarged mediastinal, hilar, or axillary lymph nodes. Thyroid gland, trachea, and esophagus demonstrate no significant findings. Lungs/Pleura: Unchanged, bandlike scarring of the upper lobes and lung apices. No pleural effusion or pneumothorax. Musculoskeletal: No chest wall abnormality. No acute osseous findings. CT ABDOMEN PELVIS FINDINGS Hepatobiliary: No solid liver abnormality is seen. No gallstones, gallbladder wall thickening, or biliary dilatation. Pancreas: Unremarkable. No pancreatic ductal dilatation or surrounding inflammatory changes. Spleen: Normal in size without significant abnormality. Adrenals/Urinary Tract: Adrenal glands are unremarkable. Simple, benign bilateral renal cortical cysts, for which no further follow-up or characterization is required. Kidneys are otherwise normal, without renal calculi, solid lesion, or hydronephrosis. Bladder is unremarkable. Stomach/Bowel: Stomach is within normal limits. Appendix appears normal. No evidence of bowel wall thickening, distention, or inflammatory changes. Status post rectosigmoid colon resection and reanastomosis. Vascular/Lymphatic: Aortic atherosclerosis. No enlarged abdominal or pelvic lymph nodes. Reproductive: No mass or other abnormality. Other: No abdominal wall hernia. Status post lower abdominal and inguinal mesh hernia repair. No ascites. Musculoskeletal: No acute osseous findings. IMPRESSION: 1. Status  post rectosigmoid colon resection and reanastomosis. 2. No evidence of recurrent or metastatic disease in the chest, abdomen, or pelvis. 3. Status post lower abdominal and inguinal mesh hernia repair. 4. Coronary artery disease. Aortic Atherosclerosis (ICD10-I70.0). Electronically Signed   By: Jearld Lesch M.D.   On: 11/01/2022 11:42    Medications: I have reviewed the patient's current medications.   Assessment/Plan: Rectal cancer, stage II (T3 N0), proximal rectum, status post a low anterior resection 11/13/2015 Microsatellite stable, no loss of mismatch repair protein expression 15 negative lymph nodes, no lymphovascular or perineural invasion Grade 1 Colonoscopy 04/09/2016-polyp was removed from the descending and transverse colon- tubular adenomas and sessile serrated polyp, polyp at the sigmoid anastomosis-polypoid colonic mucosa Colonoscopy 05/31/2019-diverticulosis, internal hemorrhoids Elevated CEA June 2022 CTs 11/26/2020-no evidence of recurrent disease, mild circumferential wall thickening in the distal esophagus, further review confirms asymmetry with fullness in the right rectus/abdominal wall Guardant reveal 11/26/2020-ctDNA not detected Biopsy of right lower quadrant abdominal wall mass on 01/01/2021-metastatic adenocarcinoma 01/30/2021-excision of abdominal wall mass metastatic colorectal carcinoma, carcinoma involves fibroconnective tissue and skeletal muscle, carcinoma abuts but does not involve the inked resection margin Cycle 1 Xeloda 03/03/2021 Cycle 2 Xeloda 03/24/2021 Cycle 3 Xeloda 04/14/2021 Cycle 4 Xeloda 05/05/2021 Cycle 5 Xeloda 05/26/2021 Cycle 6 Xeloda 06/16/2021 Cycle 7 Xeloda 07/07/2021, dose reduction to 1500 mg twice daily secondary to hyperbilirubinemia Cycle 8 Xeloda 07/28/2021, 1500 mg twice daily CTs 11/02/2021-no evidence of metastatic disease CT 10/29/2022-no evidence of metastatic disease   History of kidney stones   3.   Family history of colon cancer    4.   Multiple polyps noted on the colonoscopy 10/08/2015 Colonoscopy 05/31/2019-no polyps   5.   Gout  6.   Hyperbilirubinemia-likely secondary to toxicity from Xeloda      Disposition: Mr. Odem is in clinical remission from colon cancer.  The CEA is normal and the restaging CT showed no evidence of recurrent disease.  I reviewed the CT findings and images with Mr. Scorsone.  He will return for an office visit and CEA in 4 months.  Thornton Papas, MD  11/02/2022  10:23 AM

## 2022-11-15 ENCOUNTER — Encounter: Payer: Self-pay | Admitting: Oncology

## 2023-02-22 ENCOUNTER — Inpatient Hospital Stay: Payer: BC Managed Care – PPO

## 2023-02-22 ENCOUNTER — Inpatient Hospital Stay: Payer: BC Managed Care – PPO | Admitting: Oncology

## 2023-03-30 ENCOUNTER — Inpatient Hospital Stay (HOSPITAL_BASED_OUTPATIENT_CLINIC_OR_DEPARTMENT_OTHER): Payer: BC Managed Care – PPO | Admitting: Oncology

## 2023-03-30 ENCOUNTER — Inpatient Hospital Stay: Payer: BC Managed Care – PPO | Attending: Oncology

## 2023-03-30 VITALS — BP 127/83 | HR 61 | Temp 98.1°F | Resp 18 | Ht 70.0 in | Wt 233.0 lb

## 2023-03-30 DIAGNOSIS — Z860100 Personal history of colon polyps, unspecified: Secondary | ICD-10-CM | POA: Insufficient documentation

## 2023-03-30 DIAGNOSIS — Z800 Family history of malignant neoplasm of digestive organs: Secondary | ICD-10-CM | POA: Insufficient documentation

## 2023-03-30 DIAGNOSIS — C20 Malignant neoplasm of rectum: Secondary | ICD-10-CM

## 2023-03-30 DIAGNOSIS — Z87442 Personal history of urinary calculi: Secondary | ICD-10-CM | POA: Diagnosis not present

## 2023-03-30 DIAGNOSIS — Z85038 Personal history of other malignant neoplasm of large intestine: Secondary | ICD-10-CM | POA: Insufficient documentation

## 2023-03-30 DIAGNOSIS — M109 Gout, unspecified: Secondary | ICD-10-CM | POA: Insufficient documentation

## 2023-03-30 LAB — CEA (ACCESS): CEA (CHCC): 1.25 ng/mL (ref 0.00–5.00)

## 2023-03-30 NOTE — Progress Notes (Signed)
  [facility] OFFICE PROGRESS NOTE   Diagnosis: Colon cancer  INTERVAL HISTORY:   Jon Frost returns as scheduled.  He feels well.  Good appetite and energy level.  No difficulty with bowel function.  No bleeding.  He is working.  No abdominal pain.  Objective:  Vital signs in last 24 hours:  Blood pressure 127/83, pulse 61, temperature 98.1 F (36.7 C), temperature source Temporal, resp. rate 18, height 5\' 10"  (1.778 m), weight 233 lb (105.7 kg), SpO2 99%.    Lymphatics: No cervical, supraclavicular, axillary, or inguinal nodes Resp: Lungs clear bilaterally Cardio: Regular rate and rhythm GI: No mass, nontender, no hepatosplenomegaly no evidence for recurrent tumor at the right abdomen  Vascular: No leg edema  Lab Results:  Lab Results  Component Value Date   WBC 5.1 09/25/2021   HGB 14.6 09/25/2021   HCT 41.6 09/25/2021   MCV 99.8 09/25/2021   PLT 151 09/25/2021   NEUTROABS 2.8 09/25/2021    CMP  Lab Results  Component Value Date   NA 138 10/29/2022   K 4.5 10/29/2022   CL 102 10/29/2022   CO2 30 10/29/2022   GLUCOSE 116 (H) 10/29/2022   BUN 16 10/29/2022   CREATININE 1.19 10/29/2022   CALCIUM 10.1 10/29/2022   PROT 7.7 11/04/2021   ALBUMIN 4.4 11/04/2021   AST 36 11/04/2021   ALT 24 11/04/2021   ALKPHOS 46 11/04/2021   BILITOT 1.2 11/04/2021   GFRNONAA >60 10/29/2022   GFRAA >60 06/09/2018    Lab Results  Component Value Date   CEA1 35.58 (H) 11/14/2020   CEA 1.21 10/29/2022     Medications: I have reviewed the patient's current medications.   Assessment/Plan: Rectal cancer, stage II (T3 N0), proximal rectum, status post a low anterior resection 11/13/2015 Microsatellite stable, no loss of mismatch repair protein expression 15 negative lymph nodes, no lymphovascular or perineural invasion Grade 1 Colonoscopy 04/09/2016-polyp was removed from the descending and transverse colon- tubular adenomas and sessile serrated polyp, polyp at  the sigmoid anastomosis-polypoid colonic mucosa Colonoscopy 05/31/2019-diverticulosis, internal hemorrhoids Elevated CEA June 2022 CTs 11/26/2020-no evidence of recurrent disease, mild circumferential wall thickening in the distal esophagus, further review confirms asymmetry with fullness in the right rectus/abdominal wall Guardant reveal 11/26/2020-ctDNA not detected Biopsy of right lower quadrant abdominal wall mass on 01/01/2021-metastatic adenocarcinoma 01/30/2021-excision of abdominal wall mass metastatic colorectal carcinoma, carcinoma involves fibroconnective tissue and skeletal muscle, carcinoma abuts but does not involve the inked resection margin Cycle 1 Xeloda 03/03/2021 Cycle 2 Xeloda 03/24/2021 Cycle 3 Xeloda 04/14/2021 Cycle 4 Xeloda 05/05/2021 Cycle 5 Xeloda 05/26/2021 Cycle 6 Xeloda 06/16/2021 Cycle 7 Xeloda 07/07/2021, dose reduction to 1500 mg twice daily secondary to hyperbilirubinemia Cycle 8 Xeloda 07/28/2021, 1500 mg twice daily CTs 11/02/2021-no evidence of metastatic disease CT 10/29/2022-no evidence of metastatic disease   History of kidney stones   3.   Family history of colon cancer   4.   Multiple polyps noted on the colonoscopy 10/08/2015 Colonoscopy 05/31/2019-no polyps Colonoscopy 10/14/2022-polyps in the transverse colon-follow-up void colonic mucosa   5.   Gout  6.   Hyperbilirubinemia-likely secondary to toxicity from Xeloda       Disposition: Jon Frost remains in clinical remission from colon cancer.  The CEA is normal today.  He will return for an office visit, CEA, and surveillance CTs in 6 months.  Thornton Papas, MD  03/30/2023  10:11 AM

## 2023-03-30 NOTE — Telephone Encounter (Signed)
Telephone call  

## 2023-10-10 ENCOUNTER — Ambulatory Visit
Admission: RE | Admit: 2023-10-10 | Discharge: 2023-10-10 | Disposition: A | Discharge status: Home / Self Care | Payer: BC Managed Care – PPO | Source: Ambulatory Visit | Attending: Oncology | Admitting: Oncology

## 2023-10-10 ENCOUNTER — Inpatient Hospital Stay: Payer: BC Managed Care – PPO | Attending: Oncology

## 2023-10-10 DIAGNOSIS — Z79899 Other long term (current) drug therapy: Secondary | ICD-10-CM | POA: Diagnosis not present

## 2023-10-10 DIAGNOSIS — C19 Malignant neoplasm of rectosigmoid junction: Secondary | ICD-10-CM | POA: Insufficient documentation

## 2023-10-10 DIAGNOSIS — Z87442 Personal history of urinary calculi: Secondary | ICD-10-CM | POA: Insufficient documentation

## 2023-10-10 DIAGNOSIS — Z9221 Personal history of antineoplastic chemotherapy: Secondary | ICD-10-CM | POA: Insufficient documentation

## 2023-10-10 DIAGNOSIS — C20 Malignant neoplasm of rectum: Secondary | ICD-10-CM

## 2023-10-10 DIAGNOSIS — Z800 Family history of malignant neoplasm of digestive organs: Secondary | ICD-10-CM | POA: Insufficient documentation

## 2023-10-10 LAB — BASIC METABOLIC PANEL - CANCER CENTER ONLY
Anion gap: 11 (ref 5–15)
BUN: 15 mg/dL (ref 8–23)
CO2: 27 mmol/L (ref 22–32)
Calcium: 10.1 mg/dL (ref 8.9–10.3)
Chloride: 102 mmol/L (ref 98–111)
Creatinine: 1.13 mg/dL (ref 0.61–1.24)
GFR, Estimated: >60 mL/min (ref >60)
Glucose, Bld: 118 mg/dL — ABNORMAL HIGH (ref 70–99)
Potassium: 4.7 mmol/L (ref 3.5–5.1)
Sodium: 140 mmol/L (ref 135–145)

## 2023-10-10 LAB — CEA (ACCESS): CEA (CHCC): 1.23 ng/mL (ref 0.00–5.00)

## 2023-10-10 MED ORDER — IOPAMIDOL (ISOVUE-300) INJECTION 61%
100.0000 mL | Freq: Once | INTRAVENOUS | Status: AC | PRN
  Administered 2023-10-10: 100 mL via INTRAVENOUS

## 2023-10-18 ENCOUNTER — Inpatient Hospital Stay (HOSPITAL_BASED_OUTPATIENT_CLINIC_OR_DEPARTMENT_OTHER): Payer: BC Managed Care – PPO | Admitting: Oncology

## 2023-10-18 VITALS — BP 137/89 | HR 65 | Temp 97.9°F | Resp 18 | Ht 70.0 in | Wt 235.1 lb

## 2023-10-18 DIAGNOSIS — C20 Malignant neoplasm of rectum: Secondary | ICD-10-CM

## 2023-10-18 DIAGNOSIS — C19 Malignant neoplasm of rectosigmoid junction: Secondary | ICD-10-CM | POA: Diagnosis not present

## 2023-10-18 NOTE — Progress Notes (Signed)
  [facility] OFFICE PROGRESS NOTE   Diagnosis: Colon cancer  INTERVAL HISTORY:   Jon. Bazile returns as scheduled.  He feels well.  Good appetite.  No difficulty with bowel function.  He is working.  No new complaint.  Objective:  Vital signs in last 24 hours:  Blood pressure 137/89, pulse 65, temperature 97.9 F (36.6 C), temperature source Temporal, resp. rate 18, height 5\' 10"  (1.778 m), weight 235 lb 1.6 oz (106.6 kg), SpO2 98%.    Lymphatics: No cervical, supraclavicular, axillary, or inguinal nodes Resp: Lungs clear bilaterally Cardio: Regular rate and rhythm GI: No hepatosplenomegaly, no mass, nontender Vascular: No leg edema   Lab Results:  Lab Results  Component Value Date   WBC 5.1 09/25/2021   HGB 14.6 09/25/2021   HCT 41.6 09/25/2021   MCV 99.8 09/25/2021   PLT 151 09/25/2021   NEUTROABS 2.8 09/25/2021    CMP  Lab Results  Component Value Date   NA 140 10/10/2023   K 4.7 10/10/2023   CL 102 10/10/2023   CO2 27 10/10/2023   GLUCOSE 118 (H) 10/10/2023   BUN 15 10/10/2023   CREATININE 1.13 10/10/2023   CALCIUM  10.1 10/10/2023   PROT 7.7 11/04/2021   ALBUMIN 4.4 11/04/2021   AST 36 11/04/2021   ALT 24 11/04/2021   ALKPHOS 46 11/04/2021   BILITOT 1.2 11/04/2021   GFRNONAA >60 10/10/2023   GFRAA >60 06/09/2018    Lab Results  Component Value Date   CEA1 35.58 (H) 11/14/2020   CEA 1.23 10/10/2023    Medications: I have reviewed the patient's current medications.   Assessment/Plan: Rectal cancer, stage II (T3 N0), proximal rectum, status post a low anterior resection 11/13/2015 Microsatellite stable, no loss of mismatch repair protein expression 15 negative lymph nodes, no lymphovascular or perineural invasion Grade 1 Colonoscopy 04/09/2016-polyp was removed from the descending and transverse colon- tubular adenomas and sessile serrated polyp, polyp at the sigmoid anastomosis-polypoid colonic mucosa Colonoscopy  05/31/2019-diverticulosis, internal hemorrhoids Elevated CEA June 2022 CTs 11/26/2020-no evidence of recurrent disease, mild circumferential wall thickening in the distal esophagus, further review confirms asymmetry with fullness in the right rectus/abdominal wall Guardant reveal 11/26/2020-ctDNA not detected Biopsy of right lower quadrant abdominal wall mass on 01/01/2021-metastatic adenocarcinoma 01/30/2021-excision of abdominal wall mass metastatic colorectal carcinoma, carcinoma involves fibroconnective tissue and skeletal muscle, carcinoma abuts but does not involve the inked resection margin Cycle 1 Xeloda  03/03/2021 Cycle 2 Xeloda  03/24/2021 Cycle 3 Xeloda  04/14/2021 Cycle 4 Xeloda  05/05/2021 Cycle 5 Xeloda  05/26/2021 Cycle 6 Xeloda  06/16/2021 Cycle 7 Xeloda  07/07/2021, dose reduction to 1500 mg twice daily secondary to hyperbilirubinemia Cycle 8 Xeloda  07/28/2021, 1500 mg twice daily CTs 11/02/2021-no evidence of metastatic disease CT 10/29/2022-no evidence of metastatic disease CT 10/10/2023-no evidence of metastatic disease   History of kidney stones   3.   Family history of colon cancer   4.   Multiple polyps noted on the colonoscopy 10/08/2015 Colonoscopy 05/31/2019-no polyps Colonoscopy 10/14/2022-polyps in the transverse colon-follow-up void colonic mucosa   5.   Gout  6.   Hyperbilirubinemia-likely secondary to toxicity from Xeloda         Disposition: Jon Frost remains in clinical remission from colon cancer.  He is now almost 3 years out from resection of the abdominal wall recurrence.  He will return for an office visit in 6 months.  Coni Deep, MD  10/18/2023  11:50 AM

## 2023-10-25 ENCOUNTER — Other Ambulatory Visit: Payer: BC Managed Care – PPO | Admitting: Oncology

## 2024-04-17 ENCOUNTER — Encounter (HOSPITAL_COMMUNITY): Payer: Self-pay | Admitting: Surgery

## 2024-04-18 ENCOUNTER — Inpatient Hospital Stay: Attending: Oncology | Admitting: Oncology

## 2024-04-18 ENCOUNTER — Other Ambulatory Visit

## 2024-04-18 VITALS — BP 136/81 | HR 62 | Temp 97.5°F | Resp 20 | Ht 70.0 in | Wt 237.2 lb

## 2024-04-18 DIAGNOSIS — C20 Malignant neoplasm of rectum: Secondary | ICD-10-CM | POA: Diagnosis not present

## 2024-04-18 DIAGNOSIS — Z800 Family history of malignant neoplasm of digestive organs: Secondary | ICD-10-CM | POA: Diagnosis not present

## 2024-04-18 DIAGNOSIS — Z85048 Personal history of other malignant neoplasm of rectum, rectosigmoid junction, and anus: Secondary | ICD-10-CM | POA: Diagnosis present

## 2024-04-18 DIAGNOSIS — R17 Unspecified jaundice: Secondary | ICD-10-CM | POA: Diagnosis not present

## 2024-04-18 DIAGNOSIS — Z87442 Personal history of urinary calculi: Secondary | ICD-10-CM | POA: Diagnosis not present

## 2024-04-18 DIAGNOSIS — M109 Gout, unspecified: Secondary | ICD-10-CM | POA: Diagnosis not present

## 2024-04-18 LAB — CEA (ACCESS): CEA (CHCC): 1.38 ng/mL (ref 0.00–5.00)

## 2024-04-18 NOTE — Progress Notes (Signed)
 [facility] OFFICE PROGRESS NOTE   Diagnosis: Rectal cancer  INTERVAL HISTORY:   Jon Frost returns as scheduled.  He feels well.  Good appetite.  No difficulty with bowel function.  No bleeding.  He has a sensation of nerve regeneration at the right abdominal wall surgical site.  No consistent pain.  Objective:  Vital signs in last 24 hours:  Blood pressure 136/81, pulse 62, temperature (!) 97.5 F (36.4 C), temperature source Temporal, resp. rate 20, height 5' 10 (1.778 m), weight 237 lb 3.2 oz (107.6 kg), SpO2 98%.   Lymphatics: No cervical, supraclavicular, axillary, or inguinal nodes Resp: Lungs clear bilaterally Cardio: Regular rate and rhythm GI: No hepatosplenomegaly, no mass, nontender Vascular: No leg edema   Lab Results:  Lab Results  Component Value Date   WBC 5.1 09/25/2021   HGB 14.6 09/25/2021   HCT 41.6 09/25/2021   MCV 99.8 09/25/2021   PLT 151 09/25/2021   NEUTROABS 2.8 09/25/2021    CMP  Lab Results  Component Value Date   NA 140 10/10/2023   K 4.7 10/10/2023   CL 102 10/10/2023   CO2 27 10/10/2023   GLUCOSE 118 (H) 10/10/2023   BUN 15 10/10/2023   CREATININE 1.13 10/10/2023   CALCIUM  10.1 10/10/2023   PROT 7.7 11/04/2021   ALBUMIN 4.4 11/04/2021   AST 36 11/04/2021   ALT 24 11/04/2021   ALKPHOS 46 11/04/2021   BILITOT 1.2 11/04/2021   GFRNONAA >60 10/10/2023   GFRAA >60 06/09/2018    Lab Results  Component Value Date   CEA1 35.58 (H) 11/14/2020   CEA 1.38 04/18/2024    Lab Results  Component Value Date   INR 1.1 01/01/2021   LABPROT 14.0 01/01/2021    Imaging:  No results found.  Medications: I have reviewed the patient's current medications.   Assessment/Plan: Rectal cancer, stage II (T3 N0), proximal rectum, status post a low anterior resection 11/13/2015 Microsatellite stable, no loss of mismatch repair protein expression 15 negative lymph nodes, no lymphovascular or perineural invasion Grade  1 Colonoscopy 04/09/2016-polyp was removed from the descending and transverse colon- tubular adenomas and sessile serrated polyp, polyp at the sigmoid anastomosis-polypoid colonic mucosa Colonoscopy 05/31/2019-diverticulosis, internal hemorrhoids Elevated CEA June 2022 CTs 11/26/2020-no evidence of recurrent disease, mild circumferential wall thickening in the distal esophagus, further review confirms asymmetry with fullness in the right rectus/abdominal wall Guardant reveal 11/26/2020-ctDNA not detected Biopsy of right lower quadrant abdominal wall mass on 01/01/2021-metastatic adenocarcinoma 01/30/2021-excision of abdominal wall mass metastatic colorectal carcinoma, carcinoma involves fibroconnective tissue and skeletal muscle, carcinoma abuts but does not involve the inked resection margin Cycle 1 Xeloda  03/03/2021 Cycle 2 Xeloda  03/24/2021 Cycle 3 Xeloda  04/14/2021 Cycle 4 Xeloda  05/05/2021 Cycle 5 Xeloda  05/26/2021 Cycle 6 Xeloda  06/16/2021 Cycle 7 Xeloda  07/07/2021, dose reduction to 1500 mg twice daily secondary to hyperbilirubinemia Cycle 8 Xeloda  07/28/2021, 1500 mg twice daily CTs 11/02/2021-no evidence of metastatic disease CT 10/29/2022-no evidence of metastatic disease CT 10/10/2023-no evidence of metastatic disease   History of kidney stones   3.   Family history of colon cancer   4.   Multiple polyps noted on the colonoscopy 10/08/2015 Colonoscopy 05/31/2019-no polyps Colonoscopy 10/14/2022-polyps in the transverse colon-follow-up void colonic mucosa   5.   Gout  6.   Hyperbilirubinemia-likely secondary to toxicity from Xeloda        Disposition: Jon Frost is in clinical remission from rectal cancer.  He will return for an office visit and surveillance CTs in  6 months.  He will continue colonoscopy surveillance with Dr. Rosalie.  Arley Hof, MD  04/18/2024  1:32 PM

## 2024-10-18 ENCOUNTER — Inpatient Hospital Stay

## 2024-10-18 ENCOUNTER — Inpatient Hospital Stay: Admitting: Oncology
